# Patient Record
Sex: Female | Born: 1955
Health system: Southern US, Community
[De-identification: ages and names within clinical notes are randomized; demographics above are authoritative.]

## PROBLEM LIST (undated history)

## (undated) DIAGNOSIS — N183 Chronic kidney disease, stage 3 unspecified: Secondary | ICD-10-CM

## (undated) DIAGNOSIS — E1129 Type 2 diabetes mellitus with other diabetic kidney complication: Secondary | ICD-10-CM

## (undated) DIAGNOSIS — I10 Essential (primary) hypertension: Secondary | ICD-10-CM

## (undated) HISTORY — DX: Type 2 diabetes mellitus with other diabetic kidney complication: E11.29

## (undated) HISTORY — DX: Chronic kidney disease, stage 3 unspecified: N18.30

## (undated) HISTORY — PX: CHOLECYSTECTOMY: SHX55

---
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
## 2000-01-09 HISTORY — PX: ABDOMINAL HYSTERECTOMY: SHX81

## 2005-03-16 ENCOUNTER — Ambulatory Visit (HOSPITAL_COMMUNITY): Payer: Self-pay | Admitting: Psychiatry

## 2014-01-18 DIAGNOSIS — I10 Essential (primary) hypertension: Secondary | ICD-10-CM | POA: Diagnosis not present

## 2014-01-18 DIAGNOSIS — E1165 Type 2 diabetes mellitus with hyperglycemia: Secondary | ICD-10-CM | POA: Diagnosis not present

## 2014-01-18 DIAGNOSIS — M1000 Idiopathic gout, unspecified site: Secondary | ICD-10-CM | POA: Diagnosis not present

## 2014-01-18 DIAGNOSIS — F3131 Bipolar disorder, current episode depressed, mild: Secondary | ICD-10-CM | POA: Diagnosis not present

## 2014-01-18 DIAGNOSIS — Z1239 Encounter for other screening for malignant neoplasm of breast: Secondary | ICD-10-CM | POA: Diagnosis not present

## 2014-02-03 DIAGNOSIS — Z1239 Encounter for other screening for malignant neoplasm of breast: Secondary | ICD-10-CM | POA: Diagnosis not present

## 2014-02-03 DIAGNOSIS — Z1231 Encounter for screening mammogram for malignant neoplasm of breast: Secondary | ICD-10-CM | POA: Diagnosis not present

## 2014-05-06 DIAGNOSIS — R1084 Generalized abdominal pain: Secondary | ICD-10-CM | POA: Diagnosis not present

## 2014-05-19 DIAGNOSIS — I10 Essential (primary) hypertension: Secondary | ICD-10-CM | POA: Diagnosis not present

## 2014-05-19 DIAGNOSIS — E1165 Type 2 diabetes mellitus with hyperglycemia: Secondary | ICD-10-CM | POA: Diagnosis not present

## 2014-05-19 DIAGNOSIS — F3131 Bipolar disorder, current episode depressed, mild: Secondary | ICD-10-CM | POA: Diagnosis not present

## 2014-08-02 DIAGNOSIS — K120 Recurrent oral aphthae: Secondary | ICD-10-CM | POA: Diagnosis not present

## 2015-03-24 DIAGNOSIS — E119 Type 2 diabetes mellitus without complications: Secondary | ICD-10-CM | POA: Diagnosis not present

## 2015-07-06 DIAGNOSIS — E1165 Type 2 diabetes mellitus with hyperglycemia: Secondary | ICD-10-CM | POA: Diagnosis not present

## 2015-07-06 DIAGNOSIS — K589 Irritable bowel syndrome without diarrhea: Secondary | ICD-10-CM | POA: Diagnosis not present

## 2015-07-06 DIAGNOSIS — I10 Essential (primary) hypertension: Secondary | ICD-10-CM | POA: Diagnosis not present

## 2015-07-06 DIAGNOSIS — Z1239 Encounter for other screening for malignant neoplasm of breast: Secondary | ICD-10-CM | POA: Diagnosis not present

## 2015-07-06 DIAGNOSIS — Z23 Encounter for immunization: Secondary | ICD-10-CM | POA: Diagnosis not present

## 2015-08-22 DIAGNOSIS — K580 Irritable bowel syndrome with diarrhea: Secondary | ICD-10-CM | POA: Diagnosis not present

## 2015-08-25 DIAGNOSIS — M25561 Pain in right knee: Secondary | ICD-10-CM | POA: Diagnosis not present

## 2015-09-08 DIAGNOSIS — I10 Essential (primary) hypertension: Secondary | ICD-10-CM | POA: Diagnosis not present

## 2015-09-08 DIAGNOSIS — E1165 Type 2 diabetes mellitus with hyperglycemia: Secondary | ICD-10-CM | POA: Diagnosis not present

## 2015-09-08 DIAGNOSIS — Z1239 Encounter for other screening for malignant neoplasm of breast: Secondary | ICD-10-CM | POA: Diagnosis not present

## 2015-09-08 DIAGNOSIS — M1A9XX0 Chronic gout, unspecified, without tophus (tophi): Secondary | ICD-10-CM | POA: Diagnosis not present

## 2015-09-08 DIAGNOSIS — Z0000 Encounter for general adult medical examination without abnormal findings: Secondary | ICD-10-CM | POA: Diagnosis not present

## 2015-10-19 DIAGNOSIS — Z0000 Encounter for general adult medical examination without abnormal findings: Secondary | ICD-10-CM | POA: Diagnosis not present

## 2015-10-19 DIAGNOSIS — Z1239 Encounter for other screening for malignant neoplasm of breast: Secondary | ICD-10-CM | POA: Diagnosis not present

## 2015-10-19 DIAGNOSIS — E1165 Type 2 diabetes mellitus with hyperglycemia: Secondary | ICD-10-CM | POA: Diagnosis not present

## 2015-10-19 DIAGNOSIS — I10 Essential (primary) hypertension: Secondary | ICD-10-CM | POA: Diagnosis not present

## 2015-10-19 DIAGNOSIS — Z79899 Other long term (current) drug therapy: Secondary | ICD-10-CM | POA: Diagnosis not present

## 2015-10-19 DIAGNOSIS — M1A9XX0 Chronic gout, unspecified, without tophus (tophi): Secondary | ICD-10-CM | POA: Diagnosis not present

## 2015-10-23 DIAGNOSIS — R21 Rash and other nonspecific skin eruption: Secondary | ICD-10-CM | POA: Diagnosis not present

## 2015-11-16 DIAGNOSIS — K582 Mixed irritable bowel syndrome: Secondary | ICD-10-CM | POA: Diagnosis not present

## 2015-11-16 DIAGNOSIS — I10 Essential (primary) hypertension: Secondary | ICD-10-CM | POA: Diagnosis not present

## 2015-11-16 DIAGNOSIS — E1165 Type 2 diabetes mellitus with hyperglycemia: Secondary | ICD-10-CM | POA: Diagnosis not present

## 2015-11-16 DIAGNOSIS — Z23 Encounter for immunization: Secondary | ICD-10-CM | POA: Diagnosis not present

## 2016-03-15 DIAGNOSIS — Z79899 Other long term (current) drug therapy: Secondary | ICD-10-CM | POA: Diagnosis not present

## 2016-03-15 DIAGNOSIS — E1165 Type 2 diabetes mellitus with hyperglycemia: Secondary | ICD-10-CM | POA: Diagnosis not present

## 2016-03-15 DIAGNOSIS — I10 Essential (primary) hypertension: Secondary | ICD-10-CM | POA: Diagnosis not present

## 2016-05-16 DIAGNOSIS — M542 Cervicalgia: Secondary | ICD-10-CM | POA: Diagnosis not present

## 2016-05-16 DIAGNOSIS — M25512 Pain in left shoulder: Secondary | ICD-10-CM | POA: Diagnosis not present

## 2016-06-17 DIAGNOSIS — A932 Colorado tick fever: Secondary | ICD-10-CM | POA: Diagnosis not present

## 2016-07-18 DIAGNOSIS — M7580 Other shoulder lesions, unspecified shoulder: Secondary | ICD-10-CM | POA: Diagnosis not present

## 2016-07-18 DIAGNOSIS — M25461 Effusion, right knee: Secondary | ICD-10-CM | POA: Diagnosis not present

## 2016-09-19 DIAGNOSIS — L988 Other specified disorders of the skin and subcutaneous tissue: Secondary | ICD-10-CM | POA: Diagnosis not present

## 2016-10-02 DIAGNOSIS — K134 Granuloma and granuloma-like lesions of oral mucosa: Secondary | ICD-10-CM | POA: Diagnosis not present

## 2016-10-02 DIAGNOSIS — Q383 Other congenital malformations of tongue: Secondary | ICD-10-CM | POA: Diagnosis not present

## 2016-10-08 DIAGNOSIS — I10 Essential (primary) hypertension: Secondary | ICD-10-CM | POA: Diagnosis not present

## 2016-10-08 DIAGNOSIS — Z23 Encounter for immunization: Secondary | ICD-10-CM | POA: Diagnosis not present

## 2016-10-08 DIAGNOSIS — E1165 Type 2 diabetes mellitus with hyperglycemia: Secondary | ICD-10-CM | POA: Diagnosis not present

## 2016-10-08 DIAGNOSIS — Z79899 Other long term (current) drug therapy: Secondary | ICD-10-CM | POA: Diagnosis not present

## 2016-10-09 DIAGNOSIS — F319 Bipolar disorder, unspecified: Secondary | ICD-10-CM | POA: Diagnosis not present

## 2016-10-09 DIAGNOSIS — I10 Essential (primary) hypertension: Secondary | ICD-10-CM | POA: Diagnosis not present

## 2016-10-09 DIAGNOSIS — D3702 Neoplasm of uncertain behavior of tongue: Secondary | ICD-10-CM | POA: Diagnosis not present

## 2016-10-09 DIAGNOSIS — J342 Deviated nasal septum: Secondary | ICD-10-CM | POA: Diagnosis not present

## 2016-11-05 DIAGNOSIS — M25461 Effusion, right knee: Secondary | ICD-10-CM | POA: Diagnosis not present

## 2016-11-05 DIAGNOSIS — M25561 Pain in right knee: Secondary | ICD-10-CM | POA: Diagnosis not present

## 2016-11-07 DIAGNOSIS — M1990 Unspecified osteoarthritis, unspecified site: Secondary | ICD-10-CM | POA: Diagnosis not present

## 2016-11-07 DIAGNOSIS — I10 Essential (primary) hypertension: Secondary | ICD-10-CM | POA: Diagnosis not present

## 2016-11-07 DIAGNOSIS — E669 Obesity, unspecified: Secondary | ICD-10-CM | POA: Diagnosis not present

## 2016-11-07 DIAGNOSIS — K140 Glossitis: Secondary | ICD-10-CM | POA: Diagnosis not present

## 2016-11-07 DIAGNOSIS — Z79899 Other long term (current) drug therapy: Secondary | ICD-10-CM | POA: Diagnosis not present

## 2016-11-07 DIAGNOSIS — E119 Type 2 diabetes mellitus without complications: Secondary | ICD-10-CM | POA: Diagnosis not present

## 2016-11-07 DIAGNOSIS — D3702 Neoplasm of uncertain behavior of tongue: Secondary | ICD-10-CM | POA: Diagnosis not present

## 2016-11-07 DIAGNOSIS — F319 Bipolar disorder, unspecified: Secondary | ICD-10-CM | POA: Diagnosis not present

## 2016-11-07 DIAGNOSIS — Z6839 Body mass index (BMI) 39.0-39.9, adult: Secondary | ICD-10-CM | POA: Diagnosis not present

## 2016-11-07 HISTORY — PX: TONGUE SURGERY: SHX810

## 2016-11-21 DIAGNOSIS — M25561 Pain in right knee: Secondary | ICD-10-CM | POA: Diagnosis not present

## 2016-12-13 DIAGNOSIS — M25561 Pain in right knee: Secondary | ICD-10-CM | POA: Diagnosis not present

## 2016-12-13 DIAGNOSIS — I10 Essential (primary) hypertension: Secondary | ICD-10-CM | POA: Diagnosis not present

## 2016-12-13 DIAGNOSIS — Z01810 Encounter for preprocedural cardiovascular examination: Secondary | ICD-10-CM | POA: Diagnosis not present

## 2016-12-13 DIAGNOSIS — E1165 Type 2 diabetes mellitus with hyperglycemia: Secondary | ICD-10-CM | POA: Diagnosis not present

## 2016-12-19 DIAGNOSIS — F418 Other specified anxiety disorders: Secondary | ICD-10-CM | POA: Diagnosis not present

## 2016-12-24 DIAGNOSIS — M1A9XX0 Chronic gout, unspecified, without tophus (tophi): Secondary | ICD-10-CM | POA: Diagnosis not present

## 2016-12-24 DIAGNOSIS — Z6837 Body mass index (BMI) 37.0-37.9, adult: Secondary | ICD-10-CM | POA: Diagnosis not present

## 2016-12-24 DIAGNOSIS — J040 Acute laryngitis: Secondary | ICD-10-CM | POA: Diagnosis not present

## 2016-12-24 DIAGNOSIS — Z9181 History of falling: Secondary | ICD-10-CM | POA: Diagnosis not present

## 2016-12-24 DIAGNOSIS — E1165 Type 2 diabetes mellitus with hyperglycemia: Secondary | ICD-10-CM | POA: Diagnosis not present

## 2016-12-24 DIAGNOSIS — Z90710 Acquired absence of both cervix and uterus: Secondary | ICD-10-CM | POA: Diagnosis not present

## 2016-12-24 DIAGNOSIS — E669 Obesity, unspecified: Secondary | ICD-10-CM | POA: Diagnosis not present

## 2016-12-24 DIAGNOSIS — Z1211 Encounter for screening for malignant neoplasm of colon: Secondary | ICD-10-CM | POA: Diagnosis not present

## 2016-12-24 DIAGNOSIS — F3131 Bipolar disorder, current episode depressed, mild: Secondary | ICD-10-CM | POA: Diagnosis not present

## 2016-12-24 DIAGNOSIS — Z0000 Encounter for general adult medical examination without abnormal findings: Secondary | ICD-10-CM | POA: Diagnosis not present

## 2016-12-24 DIAGNOSIS — K580 Irritable bowel syndrome with diarrhea: Secondary | ICD-10-CM | POA: Diagnosis not present

## 2017-01-16 DIAGNOSIS — S83231A Complex tear of medial meniscus, current injury, right knee, initial encounter: Secondary | ICD-10-CM | POA: Diagnosis not present

## 2017-01-16 DIAGNOSIS — M1711 Unilateral primary osteoarthritis, right knee: Secondary | ICD-10-CM | POA: Diagnosis not present

## 2017-01-17 DIAGNOSIS — Z01810 Encounter for preprocedural cardiovascular examination: Secondary | ICD-10-CM | POA: Diagnosis not present

## 2017-01-17 DIAGNOSIS — I10 Essential (primary) hypertension: Secondary | ICD-10-CM | POA: Diagnosis not present

## 2017-01-17 DIAGNOSIS — Z01818 Encounter for other preprocedural examination: Secondary | ICD-10-CM | POA: Diagnosis not present

## 2017-01-17 DIAGNOSIS — M1990 Unspecified osteoarthritis, unspecified site: Secondary | ICD-10-CM | POA: Diagnosis not present

## 2017-01-17 DIAGNOSIS — M109 Gout, unspecified: Secondary | ICD-10-CM | POA: Diagnosis not present

## 2017-01-17 DIAGNOSIS — F319 Bipolar disorder, unspecified: Secondary | ICD-10-CM | POA: Diagnosis not present

## 2017-01-17 DIAGNOSIS — F418 Other specified anxiety disorders: Secondary | ICD-10-CM | POA: Diagnosis not present

## 2017-01-17 DIAGNOSIS — M23321 Other meniscus derangements, posterior horn of medial meniscus, right knee: Secondary | ICD-10-CM | POA: Diagnosis not present

## 2017-01-17 DIAGNOSIS — M9429 Chondromalacia, multiple sites: Secondary | ICD-10-CM | POA: Diagnosis not present

## 2017-01-17 DIAGNOSIS — M2241 Chondromalacia patellae, right knee: Secondary | ICD-10-CM | POA: Diagnosis not present

## 2017-01-17 DIAGNOSIS — Z79899 Other long term (current) drug therapy: Secondary | ICD-10-CM | POA: Diagnosis not present

## 2017-01-17 DIAGNOSIS — E7800 Pure hypercholesterolemia, unspecified: Secondary | ICD-10-CM | POA: Diagnosis not present

## 2017-01-17 DIAGNOSIS — S83231A Complex tear of medial meniscus, current injury, right knee, initial encounter: Secondary | ICD-10-CM | POA: Diagnosis not present

## 2017-01-17 DIAGNOSIS — M6588 Other synovitis and tenosynovitis, other site: Secondary | ICD-10-CM | POA: Diagnosis not present

## 2017-01-17 DIAGNOSIS — Z7984 Long term (current) use of oral hypoglycemic drugs: Secondary | ICD-10-CM | POA: Diagnosis not present

## 2017-01-17 DIAGNOSIS — Z6839 Body mass index (BMI) 39.0-39.9, adult: Secondary | ICD-10-CM | POA: Diagnosis not present

## 2017-01-17 DIAGNOSIS — G4700 Insomnia, unspecified: Secondary | ICD-10-CM | POA: Diagnosis not present

## 2017-01-17 DIAGNOSIS — E119 Type 2 diabetes mellitus without complications: Secondary | ICD-10-CM | POA: Diagnosis not present

## 2017-01-21 DIAGNOSIS — M6281 Muscle weakness (generalized): Secondary | ICD-10-CM | POA: Diagnosis not present

## 2017-01-21 DIAGNOSIS — M1711 Unilateral primary osteoarthritis, right knee: Secondary | ICD-10-CM | POA: Diagnosis not present

## 2017-01-21 DIAGNOSIS — M25561 Pain in right knee: Secondary | ICD-10-CM | POA: Diagnosis not present

## 2017-01-21 DIAGNOSIS — S83231D Complex tear of medial meniscus, current injury, right knee, subsequent encounter: Secondary | ICD-10-CM | POA: Diagnosis not present

## 2017-02-04 DIAGNOSIS — L501 Idiopathic urticaria: Secondary | ICD-10-CM | POA: Diagnosis not present

## 2017-02-21 DIAGNOSIS — E119 Type 2 diabetes mellitus without complications: Secondary | ICD-10-CM | POA: Diagnosis not present

## 2017-03-06 DIAGNOSIS — S83411A Sprain of medial collateral ligament of right knee, initial encounter: Secondary | ICD-10-CM | POA: Diagnosis not present

## 2017-03-06 DIAGNOSIS — M76891 Other specified enthesopathies of right lower limb, excluding foot: Secondary | ICD-10-CM | POA: Diagnosis not present

## 2017-03-06 DIAGNOSIS — M1711 Unilateral primary osteoarthritis, right knee: Secondary | ICD-10-CM | POA: Diagnosis not present

## 2017-03-06 DIAGNOSIS — G8918 Other acute postprocedural pain: Secondary | ICD-10-CM | POA: Diagnosis not present

## 2017-03-07 DIAGNOSIS — S83411A Sprain of medial collateral ligament of right knee, initial encounter: Secondary | ICD-10-CM | POA: Diagnosis not present

## 2017-06-26 DIAGNOSIS — I10 Essential (primary) hypertension: Secondary | ICD-10-CM | POA: Diagnosis not present

## 2017-07-01 DIAGNOSIS — I10 Essential (primary) hypertension: Secondary | ICD-10-CM | POA: Diagnosis not present

## 2017-07-09 DIAGNOSIS — I10 Essential (primary) hypertension: Secondary | ICD-10-CM | POA: Diagnosis not present

## 2017-08-08 DIAGNOSIS — I10 Essential (primary) hypertension: Secondary | ICD-10-CM | POA: Diagnosis not present

## 2017-08-08 DIAGNOSIS — Z6839 Body mass index (BMI) 39.0-39.9, adult: Secondary | ICD-10-CM | POA: Diagnosis not present

## 2017-08-08 DIAGNOSIS — E669 Obesity, unspecified: Secondary | ICD-10-CM | POA: Diagnosis not present

## 2017-08-19 DIAGNOSIS — S63602A Unspecified sprain of left thumb, initial encounter: Secondary | ICD-10-CM | POA: Diagnosis not present

## 2017-09-10 DIAGNOSIS — Z6839 Body mass index (BMI) 39.0-39.9, adult: Secondary | ICD-10-CM | POA: Diagnosis not present

## 2017-09-10 DIAGNOSIS — I10 Essential (primary) hypertension: Secondary | ICD-10-CM | POA: Diagnosis not present

## 2017-09-10 DIAGNOSIS — E669 Obesity, unspecified: Secondary | ICD-10-CM | POA: Diagnosis not present

## 2017-10-24 DIAGNOSIS — M1A9XX0 Chronic gout, unspecified, without tophus (tophi): Secondary | ICD-10-CM | POA: Diagnosis not present

## 2017-10-24 DIAGNOSIS — Z6841 Body Mass Index (BMI) 40.0 and over, adult: Secondary | ICD-10-CM | POA: Diagnosis not present

## 2017-10-24 DIAGNOSIS — I10 Essential (primary) hypertension: Secondary | ICD-10-CM | POA: Diagnosis not present

## 2017-10-24 DIAGNOSIS — Z23 Encounter for immunization: Secondary | ICD-10-CM | POA: Diagnosis not present

## 2017-10-24 DIAGNOSIS — E1169 Type 2 diabetes mellitus with other specified complication: Secondary | ICD-10-CM | POA: Diagnosis not present

## 2017-10-24 DIAGNOSIS — F3131 Bipolar disorder, current episode depressed, mild: Secondary | ICD-10-CM | POA: Diagnosis not present

## 2017-10-24 DIAGNOSIS — Z79899 Other long term (current) drug therapy: Secondary | ICD-10-CM | POA: Diagnosis not present

## 2017-11-02 ENCOUNTER — Observation Stay (HOSPITAL_COMMUNITY)
Admission: EM | Admit: 2017-11-02 | Discharge: 2017-11-03 | Disposition: A | Discharge status: Home / Self Care | Payer: PPO | Attending: Internal Medicine | Admitting: Internal Medicine

## 2017-11-02 ENCOUNTER — Emergency Department (HOSPITAL_COMMUNITY): Payer: PPO

## 2017-11-02 ENCOUNTER — Other Ambulatory Visit: Payer: Self-pay

## 2017-11-02 ENCOUNTER — Encounter (HOSPITAL_COMMUNITY): Payer: Self-pay | Admitting: Emergency Medicine

## 2017-11-02 DIAGNOSIS — R7989 Other specified abnormal findings of blood chemistry: Secondary | ICD-10-CM

## 2017-11-02 DIAGNOSIS — I10 Essential (primary) hypertension: Secondary | ICD-10-CM | POA: Diagnosis not present

## 2017-11-02 DIAGNOSIS — F319 Bipolar disorder, unspecified: Secondary | ICD-10-CM | POA: Diagnosis not present

## 2017-11-02 DIAGNOSIS — T461X5A Adverse effect of calcium-channel blockers, initial encounter: Secondary | ICD-10-CM | POA: Insufficient documentation

## 2017-11-02 DIAGNOSIS — E669 Obesity, unspecified: Secondary | ICD-10-CM | POA: Diagnosis not present

## 2017-11-02 DIAGNOSIS — R7303 Prediabetes: Secondary | ICD-10-CM | POA: Diagnosis not present

## 2017-11-02 DIAGNOSIS — R55 Syncope and collapse: Principal | ICD-10-CM | POA: Insufficient documentation

## 2017-11-02 DIAGNOSIS — R001 Bradycardia, unspecified: Secondary | ICD-10-CM | POA: Insufficient documentation

## 2017-11-02 DIAGNOSIS — R42 Dizziness and giddiness: Secondary | ICD-10-CM | POA: Diagnosis not present

## 2017-11-02 DIAGNOSIS — N179 Acute kidney failure, unspecified: Secondary | ICD-10-CM | POA: Insufficient documentation

## 2017-11-02 DIAGNOSIS — R51 Headache: Secondary | ICD-10-CM | POA: Diagnosis not present

## 2017-11-02 DIAGNOSIS — Z79899 Other long term (current) drug therapy: Secondary | ICD-10-CM | POA: Diagnosis not present

## 2017-11-02 DIAGNOSIS — E876 Hypokalemia: Secondary | ICD-10-CM | POA: Insufficient documentation

## 2017-11-02 DIAGNOSIS — I674 Hypertensive encephalopathy: Secondary | ICD-10-CM | POA: Diagnosis not present

## 2017-11-02 DIAGNOSIS — R748 Abnormal levels of other serum enzymes: Secondary | ICD-10-CM | POA: Diagnosis not present

## 2017-11-02 HISTORY — DX: Essential (primary) hypertension: I10

## 2017-11-02 HISTORY — DX: Syncope and collapse: R55

## 2017-11-02 LAB — COMPREHENSIVE METABOLIC PANEL
ALK PHOS: 100 U/L (ref 38–126)
ALT: 25 U/L (ref 0–44)
AST: 22 U/L (ref 15–41)
Albumin: 4.2 g/dL (ref 3.5–5.0)
Anion gap: 10 (ref 5–15)
BILIRUBIN TOTAL: 0.7 mg/dL (ref 0.3–1.2)
BUN: 28 mg/dL — ABNORMAL HIGH (ref 8–23)
CALCIUM: 9.8 mg/dL (ref 8.9–10.3)
CO2: 26 mmol/L (ref 22–32)
CREATININE: 1.52 mg/dL — AB (ref 0.44–1.00)
Chloride: 103 mmol/L (ref 98–111)
GFR calc Af Amer: 41 mL/min — ABNORMAL LOW (ref >60)
GFR calc non Af Amer: 36 mL/min — ABNORMAL LOW (ref >60)
Glucose, Bld: 119 mg/dL — ABNORMAL HIGH (ref 70–99)
Potassium: 3.5 mmol/L (ref 3.5–5.1)
SODIUM: 139 mmol/L (ref 135–145)
TOTAL PROTEIN: 7.1 g/dL (ref 6.5–8.1)

## 2017-11-02 LAB — I-STAT CHEM 8, ED
BUN: 31 mg/dL — AB (ref 8–23)
CHLORIDE: 102 mmol/L (ref 98–111)
Calcium, Ion: 1.17 mmol/L (ref 1.15–1.40)
Creatinine, Ser: 1.6 mg/dL — ABNORMAL HIGH (ref 0.44–1.00)
Glucose, Bld: 110 mg/dL — ABNORMAL HIGH (ref 70–99)
HCT: 46 % (ref 36.0–46.0)
Hemoglobin: 15.6 g/dL — ABNORMAL HIGH (ref 12.0–15.0)
Potassium: 3.4 mmol/L — ABNORMAL LOW (ref 3.5–5.1)
Sodium: 140 mmol/L (ref 135–145)
TCO2: 29 mmol/L (ref 22–32)

## 2017-11-02 LAB — DIFFERENTIAL
Abs Immature Granulocytes: 0.06 10*3/uL (ref 0.00–0.07)
BASOS ABS: 0.1 10*3/uL (ref 0.0–0.1)
Basophils Relative: 1 %
Eosinophils Absolute: 0.2 10*3/uL (ref 0.0–0.5)
Eosinophils Relative: 3 %
Immature Granulocytes: 1 %
LYMPHS ABS: 1.9 10*3/uL (ref 0.7–4.0)
LYMPHS PCT: 27 %
MONOS PCT: 12 %
Monocytes Absolute: 0.8 10*3/uL (ref 0.1–1.0)
NEUTROS ABS: 3.8 10*3/uL (ref 1.7–7.7)
Neutrophils Relative %: 56 %

## 2017-11-02 LAB — APTT: aPTT: 28 seconds (ref 24–36)

## 2017-11-02 LAB — CBC
HEMATOCRIT: 46.2 % — AB (ref 36.0–46.0)
HEMOGLOBIN: 14.4 g/dL (ref 12.0–15.0)
MCH: 28.9 pg (ref 26.0–34.0)
MCHC: 31.2 g/dL (ref 30.0–36.0)
MCV: 92.6 fL (ref 80.0–100.0)
Platelets: 280 10*3/uL (ref 150–400)
RBC: 4.99 MIL/uL (ref 3.87–5.11)
RDW: 14 % (ref 11.5–15.5)
WBC: 6.8 10*3/uL (ref 4.0–10.5)
nRBC: 0 % (ref 0.0–0.2)

## 2017-11-02 LAB — PROTIME-INR
INR: 1
PROTHROMBIN TIME: 13.1 s (ref 11.4–15.2)

## 2017-11-02 LAB — I-STAT TROPONIN, ED: Troponin i, poc: 0 ng/mL (ref 0.00–0.08)

## 2017-11-02 MED ORDER — ACETAMINOPHEN 650 MG RE SUPP: 650.0000 mg | Freq: Four times a day (QID) | RECTAL | Status: DC | PRN

## 2017-11-02 MED ORDER — LOSARTAN POTASSIUM 50 MG PO TABS
100.0000 mg | ORAL_TABLET | Freq: Every day | ORAL | Status: DC
  Administered 2017-11-03: 100 mg via ORAL
  Filled 2017-11-02: qty 2

## 2017-11-02 MED ORDER — ENOXAPARIN SODIUM 40 MG/0.4ML ~~LOC~~ SOLN
40.0000 mg | Freq: Every day | SUBCUTANEOUS | Status: DC
  Filled 2017-11-02: qty 0.4

## 2017-11-02 MED ORDER — POTASSIUM CHLORIDE CRYS ER 20 MEQ PO TBCR
40.0000 meq | EXTENDED_RELEASE_TABLET | Freq: Once | ORAL | Status: AC
  Administered 2017-11-03: 40 meq via ORAL
  Filled 2017-11-02: qty 2

## 2017-11-02 MED ORDER — ACETAMINOPHEN 325 MG PO TABS: 650.0000 mg | ORAL_TABLET | Freq: Four times a day (QID) | ORAL | Status: DC | PRN

## 2017-11-02 MED ORDER — BUPROPION HCL ER (XL) 150 MG PO TB24
300.0000 mg | ORAL_TABLET | Freq: Every day | ORAL | Status: DC
  Administered 2017-11-03: 300 mg via ORAL
  Filled 2017-11-02: qty 2

## 2017-11-02 MED ORDER — CLONIDINE HCL 0.1 MG PO TABS
0.1000 mg | ORAL_TABLET | Freq: Two times a day (BID) | ORAL | Status: DC
  Administered 2017-11-03: 0.1 mg via ORAL
  Filled 2017-11-02: qty 1

## 2017-11-02 MED ORDER — ATENOLOL 25 MG PO TABS: 25.0000 mg | ORAL_TABLET | Freq: Two times a day (BID) | ORAL | Status: DC

## 2017-11-02 MED ORDER — HYDROCHLOROTHIAZIDE 25 MG PO TABS
25.0000 mg | ORAL_TABLET | Freq: Every day | ORAL | Status: DC
  Filled 2017-11-02: qty 1

## 2017-11-02 MED ORDER — LOSARTAN POTASSIUM 50 MG PO TABS: 100.0000 mg | ORAL_TABLET | Freq: Every day | ORAL | Status: DC

## 2017-11-02 MED ORDER — QUETIAPINE FUMARATE 100 MG PO TABS
100.0000 mg | ORAL_TABLET | Freq: Every day | ORAL | Status: DC
  Administered 2017-11-03: 100 mg via ORAL
  Filled 2017-11-02 (×2): qty 1

## 2017-11-02 NOTE — ED Provider Notes (Signed)
[place] EMERGENCY DEPARTMENT Provider Note   CSN: [id number] Arrival date & time: 11/02/17  1539     History   Chief Complaint Chief Complaint  Patient presents with  . Dizziness    HPI Donna Castaneda is a 62 y.o. female who presents with lightheadedness and syncope. PMH significant for HTN, bipolar d/o. She states that since June she has not been able to control her blood pressure. She was on Losartan/HCTZ and Atenolol only. She went to her doctor and her BP was in the 190s therefore Clonidine was added. She went for another check up and the dose was doubled. It still wasn't controlled and Losartan was changed to Olmesartan but this made her feel bad and have IBS symptoms so it was changed back to Losartan and she was put on Amlodipine. She has been taking this for about a week. She gets lightheaded with getting up and states that when she is in a car and going around a curve it feels like she's "on a rollercoaster" and like she was "tired". She went to UC today and they told her to come to the ED. She doesn't like [facility] so she came here. She denies current headache, chest pain, SOB, abdominal pain, leg swelling. She hasn't taken any BP meds today. She did have a syncopal episode on Tuesday. She was walking to the kitchen and had sudden LOC without prodromal symptoms.  HPI  Past Medical History:  Diagnosis Date  . Hypertension     There are no active problems to display for this patient.   History reviewed. No pertinent surgical history.   OB History   None      Home Medications    Prior to Admission medications   Not on File    Family History No family history on file.  Social History Social History   Tobacco Use  . Smoking status: Never Smoker  . Smokeless tobacco: Never Used  Substance Use Topics  . Alcohol use: Not Currently  . Drug use: Never     Allergies   Patient has no known allergies.   Review of  Systems Review of Systems  Constitutional: Negative for fever.  Respiratory: Negative for shortness of breath.   Cardiovascular: Negative for chest pain.  Gastrointestinal: Negative for abdominal pain.  Neurological: Positive for syncope and light-headedness. Negative for headaches.  All other systems reviewed and are negative.    Physical Exam Updated Vital Signs BP (!) 146/99   Pulse (!) 57   Temp 97.9 F (36.6 C) (Oral)   Resp (!) 21   SpO2 100%   Physical Exam  Constitutional: She is oriented to person, place, and time. She appears well-developed and well-nourished. No distress.  Calm, cooperative, pleasant  HENT:  Head: Normocephalic and atraumatic.  Eyes: Pupils are equal, round, and reactive to light. Conjunctivae are normal. Right eye exhibits no discharge. Left eye exhibits no discharge. No scleral icterus.  Neck: Normal range of motion.  Cardiovascular: Normal rate and regular rhythm.  Pulmonary/Chest: Effort normal and breath sounds normal. No respiratory distress.  Abdominal: Soft. Bowel sounds are normal. She exhibits no distension. There is no tenderness.  Musculoskeletal:  No peripheral edema  Neurological: She is alert and oriented to person, place, and time.  Skin: Skin is warm and dry.  Psychiatric: She has a normal mood and affect. Her behavior is normal.  Nursing note and vitals reviewed.    ED Treatments / Results  Labs (all  labs ordered are listed, but only abnormal results are displayed) Labs Reviewed  CBC - Abnormal; Notable for the following components:      Result Value   HCT 46.2 (*)    All other components within normal limits  COMPREHENSIVE METABOLIC PANEL - Abnormal; Notable for the following components:   Glucose, Bld 119 (*)    BUN 28 (*)    Creatinine, Ser 1.52 (*)    GFR calc non Af Amer 36 (*)    GFR calc Af Amer 41 (*)    All other components within normal limits  I-STAT CHEM 8, ED - Abnormal; Notable for the following  components:   Potassium 3.4 (*)    BUN 31 (*)    Creatinine, Ser 1.60 (*)    Glucose, Bld 110 (*)    Hemoglobin 15.6 (*)    All other components within normal limits  PROTIME-INR  APTT  DIFFERENTIAL  I-STAT TROPONIN, ED    EKG EKG Interpretation  Date/Time:  Saturday November 02 2017 16:17:38 EDT Ventricular Rate:  56 PR Interval:  174 QRS Duration: 82 QT Interval:  436 QTC Calculation: 420 R Axis:   -9 Text Interpretation:  Sinus bradycardia Cannot rule out Anterior infarct , age undetermined Abnormal ECG no acute ischemic change. no old comparison Confirmed by Charlesetta Shanks [phone]) on 11/02/2017 10:08:08 PM   Radiology Ct Head Wo Contrast  Result Date: 11/02/2017 CLINICAL DATA:  Hypertension, headache and dizziness over the last several weeks. EXAM: CT HEAD WITHOUT CONTRAST TECHNIQUE: Contiguous axial images were obtained from the base of the skull through the vertex without intravenous contrast. COMPARISON:  Brain MRI, 04/11/2010 FINDINGS: Brain: No evidence of acute infarction, hemorrhage, hydrocephalus, extra-axial collection or mass lesion/mass effect. Vascular: No hyperdense vessel or unexpected calcification. Skull: Normal. Negative for fracture or focal lesion. Sinuses/Orbits: Visualized globes and orbits are within normal limits. Visualized sinuses and mastoid air cells are clear. Other: None. IMPRESSION: Normal unenhanced CT scan of the brain. Electronically Signed   By: Lajean Manes M.D.   On: 11/02/2017 18:26    Procedures Procedures (including critical care time)  Medications Ordered in ED Medications - No data to display   Initial Impression / Assessment and Plan / ED Course  I have reviewed the triage vital signs and the nursing notes.  Pertinent labs & imaging results that were available during my care of the patient were reviewed by me and considered in my medical decision making (see chart for details).  62 year old female presents with  lightheadedness and sudden onset of syncope. She is bradycardic. Blood pressures here have been ranging between 277A-128N systolic. Orthostatics are negative. EKG is sinus bradycardia. CBC is normal. CMP is remarkable for elevated SCr. She denies any kidney issues that she knows of. Trop is 0. In triage, a stroke order set was utilized. I do not feel that symptoms are consistent with stroke and sounds more related to her blood pressure vs cardiac etiology. Some concern for secondary hypertension since she is on 5 BP meds and still has significantly elevated BP. Due to her sudden syncope, will bring in for obs. Discussed with hospitalist for admission.  Final Clinical Impressions(s) / ED Diagnoses   Final diagnoses:  Syncope, unspecified syncope type  Elevated serum creatinine    ED Discharge Orders    None       Recardo Evangelist, PA-C 11/02/17 2349    Charlesetta Shanks, MD 11/03/17 [phone]

## 2017-11-02 NOTE — ED Triage Notes (Addendum)
Pt c/o constant dizziness x 1 month. Reports she has been having problems managing her BP, recently started on amlodipine. A&O x 4, ambulatory without difficulty.

## 2017-11-02 NOTE — H&P (Signed)
History and Physical  Donna Castaneda WCH:[id number] DOB: 1955-02-19 DOA: 11/02/2017 1612  Referring physician: Raquel James (ED) PCP: Greig Right, MD   HISTORY   Chief Complaint: syncope  HPI: Donna Castaneda is a 62 y.o. female with HTN, obesity, bipolar disorder, and pre-diabetes who presents with syncopal episode. Since around June, she has been diagnosed with severe HTN (prior baseline BP were in 353I systolics but jumped to 144-315Q at outside clinic). Since then, her PCP has been adding anti-HTN agents (taking total 5 agents) including clonidine and amlodipine in addition to her previous regimen. Her prior BP regimen includes losartan, atenolol, and HCTZ. Ever since being prescribed amlodipine last week, patient has noted dizzy spells. Patient uses a vitals tracker on her iPad which has documented BP as low as 110/80 with associated severe dizzy spell that occurred after taking the amlodipine while she was a passenger in the car. She does not recall clear orthostasis sx or dizziness prior to the syncopal event on day of admission -- she had been ambulating from the kitchen to the living room; but she does note having lightheadedness pretty much every day since starting amlodipine. Denies prior cardiac history or renal dysfunction.  Note: patient resides in [place] and sees PCP there (Dr. Burnett Sheng). No prior secondary HTN workup .    Review of Systems:  + syncope today and lightheadedness x 1 week - no fevers/chills - no cough - no chest pain, dyspnea on exertion - no edema, PND, orthopnea - no nausea/vomiting; no tarry, melanotic or bloody stools - no dysuria, increased urinary frequency - no weight changes  Rest of systems reviewed are negative, except as per above history.   ED course:  Vitals Blood pressure 139/66, pulse (!) 55, temperature 97.9 F (36.6 C), temperature source Oral, resp. rate 19, SpO2 100 %. CT head negative.   Past Medical History:  Diagnosis Date  .  Hypertension    History reviewed. No pertinent surgical history.  Social History:  reports that she has never smoked. She has never used smokeless tobacco. She reports that she drank alcohol. She reports that she does not use drugs.  No Known Allergies  No family history on file.    Prior to Admission medications   Medication Sig Start Date End Date Taking? Authorizing Provider  amLODipine (NORVASC) 5 MG tablet Take 5 mg by mouth daily.   Yes [provider]  atenolol (TENORMIN) 25 MG tablet Take 50 mg by mouth 2 (two) times daily.   Yes [provider]  buPROPion (WELLBUTRIN XL) 300 MG 24 hr tablet Take 300 mg by mouth daily.   Yes [provider]  cloNIDine (CATAPRES) 0.1 MG tablet Take 0.1 mg by mouth 2 (two) times daily.   Yes [provider]  hydrochlorothiazide (HYDRODIURIL) 25 MG tablet Take 25 mg by mouth daily.   Yes [provider]  losartan (COZAAR) 100 MG tablet Take 100 mg by mouth daily.   Yes [provider]  metFORMIN (GLUCOPHAGE) 500 MG tablet Take 500 mg by mouth 2 (two) times daily with a meal.   Yes [provider]  QUEtiapine (SEROQUEL) 100 MG tablet Take 100 mg by mouth at bedtime.   Yes [provider]    PHYSICAL EXAM   Temp:  [97.9 F (36.6 C)] 97.9 F (36.6 C) (10/26 1615) Pulse Rate:  [51-57] 55 (10/26 2145) Cardiac Rhythm: Normal sinus rhythm (10/26 1852) Resp:  [12-21] 19 (10/26 2145) BP: (138-160)/(66-109) 139/66 (  10/26 2145) SpO2:  [98 %-100 %] 100 % (10/26 2145)  BP 139/66   Pulse (!) 55   Temp 97.9 F (36.6 C) (Oral)   Resp 19   SpO2 100%    GEN morbidly obese middle-aged caucasian female; resting comfortably in bed, using her iPad  HEENT NCAT EOM intact PERRL; clear oropharynx, no cervical LAD; moist mucus membranes  JVP estimated 5-6 cm H2O above RA; no HJR ; no carotid bruits b/l ;  CV regular normal rate; quiet S1 and S2; no m/r/g or S3/S4; PMI non displaced; no  parasternal heave  RESP CTA b/l; breathing unlabored and symmetric  ABD soft NT ND +normoactive BS  EXT warm throughout b/l; no peripheral edema b/l  PULSES  DP and radials 2+ intact b/l  SKIN/MSK no rashes or lesions  NEURO/PSYCH AAOx4; no focal deficits   DATA   LABS ON ADMISSION:  Basic Metabolic Panel: Recent Labs  Lab 11/02/17 1633 11/02/17 1719  NA 139 140  K 3.5 3.4*  CL 103 102  CO2 26  --   GLUCOSE 119* 110*  BUN 28* 31*  CREATININE 1.52* 1.60*  CALCIUM 9.8  --    CBC: Recent Labs  Lab 11/02/17 1633 11/02/17 1719  WBC 6.8  --   NEUTROABS 3.8  --   HGB 14.4 15.6*  HCT 46.2* 46.0  MCV 92.6  --   PLT 280  --    Liver Function Tests: Recent Labs  Lab 11/02/17 1633  AST 22  ALT 25  ALKPHOS 100  BILITOT 0.7  PROT 7.1  ALBUMIN 4.2   No results for input(s): LIPASE, AMYLASE in the last 168 hours. No results for input(s): AMMONIA in the last 168 hours. Coagulation:  Lab Results  Component Value Date   INR 1.00 11/02/2017   No results found for: PTT Lactic Acid, Venous:  No results found for: LATICACIDVEN Cardiac Enzymes: No results for input(s): CKTOTAL, CKMB, CKMBINDEX, TROPONINI in the last 168 hours. Urinalysis: No results found for: COLORURINE, APPEARANCEUR, LABSPEC, PHURINE, GLUCOSEU, HGBUR, BILIRUBINUR, KETONESUR, PROTEINUR, UROBILINOGEN, NITRITE, LEUKOCYTESUR  BNP (last 3 results) No results for input(s): PROBNP in the last 8760 hours. CBG: No results for input(s): GLUCAP in the last 168 hours.  Radiological Exams on Admission: Ct Head Wo Contrast  Result Date: 11/02/2017 CLINICAL DATA:  Hypertension, headache and dizziness over the last several weeks. EXAM: CT HEAD WITHOUT CONTRAST TECHNIQUE: Contiguous axial images were obtained from the base of the skull through the vertex without intravenous contrast. COMPARISON:  Brain MRI, 04/11/2010 FINDINGS: Brain: No evidence of acute infarction, hemorrhage, hydrocephalus, extra-axial collection  or mass lesion/mass effect. Vascular: No hyperdense vessel or unexpected calcification. Skull: Normal. Negative for fracture or focal lesion. Sinuses/Orbits: Visualized globes and orbits are within normal limits. Visualized sinuses and mastoid air cells are clear. Other: None. IMPRESSION: Normal unenhanced CT scan of the brain. Electronically Signed   By: Lajean Manes M.D.   On: 11/02/2017 18:26    EKG: Independently reviewed. Sinus bradycardia at 56 bpm with no ST changes   ASSESSMENT AND PLAN   Assessment: Donna Castaneda is a 62 y.o. female with HTN, pre-diabetes, bipolar disorder, obesity, who presents with syncope. She has been having orthostatic sx for the past week leading up to the syncopal event, although no clear prodrome just prior to losing consciousness. Of note, her BP has become severely elevated over the past few months -- and her PCP has been adding anti-HTN agents to try to  control her BP. Her recent dizzy spells this past week that were documented on her iPad vitals tracker showed relative hypotension with BP 110/80s and HR in mid 50s shortly after taking her amlodipine. Thus her syncope most likely is secondary to her current anti-HTN regimen namely with the addition of amlodipine. However, given her stable yet chronic bradycardia 50s in setting of beta-blocker and clonidine and CCB (most likely explanation is iatrogenic blunting of chronotropic response) as well as unexplained elevation in BP warrants further monitoring for any arrhythmia or worsening bradycardia and at least start workup for secondary HTN, i.e. renovascular HTN. Reassuringly her recorded heart rates during previous orthostatic spells were stable in mid 50s. CT head was negative; negative troponin; EKG shows sinus brady 56 bpm. If no renal artery stenosis detected, she may also have underlying sleep apnea associated HTN.   Active Problems:   Syncope   Plan:   # Syncope -- unclear etiology, suspect due to  multiple anti-HTN agents (newest agents are amlodipine and clonidine).   > negative initial orthostats while in ED; BP range in 130-160s/80-90s; HR stable in 50s (unchanged)  - cautiously add back home anti-HTN agents as below, with the exception of amlodipine - resume losartan 100mg  qd to start tomorrow - resume HCTZ at lower dose 25mg  qd to start tomorrow (hold if hypokalemia or worsening renal dysfunction) - stop amlodipine - resume clonidine 0.1mg  BID tomorrow AM to avoid rebound - if HR remain stable in 50s, would opt to resume atenolol at a lower dose such as 25mg  qd (at home on 50mg  BID) since this is a chronic medication - telemetry - cardiac TTE ordered   # Severe HTN diagnosed ~ 1-2 months ago. On multiple anti-HTN agents as above  - renal doppler ordered  - echo ordered as above - check UA; TSH; renin-aldos; plasma metanephrines pending - adding back home BP meds as above - recommend outpatient sleep study   # AKI Cr 1.6 with unknown baseline. No prior knowledge of kidney issues per patient.  > cannot rule out pre-renal vs intrinsic - UA ordered - hold losartan if Cr worsens - instructed patient to avoid mobic (takes prn for shoulder/arm pain) at home - renal dopplers   # Hypokalemia K 3.4 > unknown baseline. Pt is on HCTZ at home - ordered PO K repletion - reordered HCTZ as above but hold if worsened hypokalemia  # Hx of chronic bipolar do - resume home seroquel 100mg  nightly - resume home bupropion XL 300mg  nightly  DVT Prophylaxis: lovenox Code Status:  Full Code Family Communication: patient Disposition Plan: admit to obs (telemetry) and dc pending echo and renal ultrasound  Patient contact: Extended Emergency Contact Information Primary Emergency Contact: Renegar,Robert Address: [address]          [place], [place] Phone: [phone] Relation: Other  Time spent: > 35 minutes  Colbert Ewing, MD [facility] Pager [phone]  If  7PM-7AM, please contact night-coverage [email or web address] Password [facility] 11/02/2017, 10:53 PM

## 2017-11-03 ENCOUNTER — Observation Stay (HOSPITAL_BASED_OUTPATIENT_CLINIC_OR_DEPARTMENT_OTHER): Payer: PPO

## 2017-11-03 DIAGNOSIS — E669 Obesity, unspecified: Secondary | ICD-10-CM

## 2017-11-03 DIAGNOSIS — I10 Essential (primary) hypertension: Secondary | ICD-10-CM

## 2017-11-03 DIAGNOSIS — I5030 Unspecified diastolic (congestive) heart failure: Secondary | ICD-10-CM | POA: Diagnosis not present

## 2017-11-03 HISTORY — DX: Essential (primary) hypertension: I10

## 2017-11-03 HISTORY — DX: Obesity, unspecified: E66.9

## 2017-11-03 LAB — CBC
HCT: 43.9 % (ref 36.0–46.0)
HCT: 45.4 % (ref 36.0–46.0)
Hemoglobin: 14 g/dL (ref 12.0–15.0)
Hemoglobin: 14.2 g/dL (ref 12.0–15.0)
MCH: 28.2 pg (ref 26.0–34.0)
MCH: 29.4 pg (ref 26.0–34.0)
MCHC: 30.8 g/dL (ref 30.0–36.0)
MCHC: 32.3 g/dL (ref 30.0–36.0)
MCV: 90.9 fL (ref 80.0–100.0)
MCV: 91.3 fL (ref 80.0–100.0)
NRBC: 0 % (ref 0.0–0.2)
PLATELETS: 265 10*3/uL (ref 150–400)
PLATELETS: 265 10*3/uL (ref 150–400)
RBC: 4.83 MIL/uL (ref 3.87–5.11)
RBC: 4.97 MIL/uL (ref 3.87–5.11)
RDW: 13.8 % (ref 11.5–15.5)
RDW: 13.9 % (ref 11.5–15.5)
WBC: 7.5 10*3/uL (ref 4.0–10.5)
WBC: 7.5 10*3/uL (ref 4.0–10.5)
nRBC: 0 % (ref 0.0–0.2)

## 2017-11-03 LAB — ECHOCARDIOGRAM COMPLETE

## 2017-11-03 LAB — BASIC METABOLIC PANEL
ANION GAP: 9 (ref 5–15)
BUN: 24 mg/dL — AB (ref 8–23)
CALCIUM: 9.8 mg/dL (ref 8.9–10.3)
CO2: 26 mmol/L (ref 22–32)
CREATININE: 1.34 mg/dL — AB (ref 0.44–1.00)
Chloride: 105 mmol/L (ref 98–111)
GFR calc Af Amer: 48 mL/min — ABNORMAL LOW (ref >60)
GFR, EST NON AFRICAN AMERICAN: 41 mL/min — AB (ref >60)
GLUCOSE: 95 mg/dL (ref 70–99)
Potassium: 3.3 mmol/L — ABNORMAL LOW (ref 3.5–5.1)
Sodium: 140 mmol/L (ref 135–145)

## 2017-11-03 LAB — CREATININE, SERUM
Creatinine, Ser: 1.36 mg/dL — ABNORMAL HIGH (ref 0.44–1.00)
GFR, EST AFRICAN AMERICAN: 47 mL/min — AB (ref >60)
GFR, EST NON AFRICAN AMERICAN: 41 mL/min — AB (ref >60)

## 2017-11-03 LAB — MAGNESIUM: Magnesium: 2 mg/dL (ref 1.7–2.4)

## 2017-11-03 LAB — TSH: TSH: 4.074 u[IU]/mL (ref 0.350–4.500)

## 2017-11-03 MED ORDER — ACETAMINOPHEN 325 MG PO TABS: 650.0000 mg | ORAL_TABLET | Freq: Four times a day (QID) | ORAL | Status: DC | PRN

## 2017-11-03 MED ORDER — POTASSIUM CHLORIDE CRYS ER 20 MEQ PO TBCR
40.0000 meq | EXTENDED_RELEASE_TABLET | Freq: Once | ORAL | Status: AC
  Administered 2017-11-03: 40 meq via ORAL
  Filled 2017-11-03: qty 2

## 2017-11-03 NOTE — Progress Notes (Signed)
  Echocardiogram 2D Echocardiogram has been performed.  Donna Castaneda 11/03/2017, 12:15 PM

## 2017-11-03 NOTE — Discharge Summary (Addendum)
Physician Discharge Summary  Donna Castaneda YDX:[id number] DOB: 1955-05-07 DOA: 11/02/2017  PCP: Greig Right, MD  Admit date: 11/02/2017 Discharge date: 11/03/2017  Admitted From: home Discharge disposition: home   Recommendations for Outpatient Follow-Up:   1. Pending labs: plasma meta, aldosterone+renin ratio 2. If BP continues to be an issue-- outpatient renal U/S 3. BMP 1 week 4. BP medications adjusted 5. D/c ibuprofen   Discharge Diagnosis:   Active Problems:   HTN (hypertension)   Obesity    Discharge Condition: Improved.  Diet recommendation: Low sodium, heart healthy  Wound care: None.  Code status: Full.   History of Present Illness:   Donna Castaneda is a 62 y.o. female with HTN, obesity, bipolar disorder, and pre-diabetes who presents with syncopal episode. Since around June, she has been diagnosed with severe HTN (prior baseline BP were in 720N systolics but jumped to [id number] at outside clinic). Since then, her PCP has been adding anti-HTN agents (taking total 5 agents) including clonidine and amlodipine in addition to her previous regimen. Her prior BP regimen includes losartan, atenolol, and HCTZ. Ever since being prescribed amlodipine last week, patient has noted dizzy spells. Patient uses a vitals tracker on her iPad which has documented BP as low as 110/80 with associated severe dizzy spell that occurred after taking the amlodipine while she was a passenger in the car. She does not recall clear orthostasis sx or dizziness prior to the syncopal event on day of admission -- she had been ambulating from the kitchen to the living room; but she does note having lightheadedness pretty much every day since starting amlodipine. Denies prior cardiac history or renal dysfunction.  Note: patient resides in [place] and sees PCP there (Dr. Burnett Sheng). No prior secondary HTN workup .     Hospital Course by Problem:   Severe HTN diagnosed ~ 1-2 months  ago. On multiple anti-HTN agents as above             - renal doppler as an outpatient             - echo: - Left ventricle: The cavity size was normal. Wall thickness was at   the upper limits of normal. Systolic function was normal. The   estimated ejection fraction was in the range of 60% to 65%. Wall   motion was normal; there were no regional wall motion   abnormalities. Doppler parameters are consistent with abnormal   left ventricular relaxation (grade 1 diastolic dysfunction). - Left atrium: The atrium was mildly to moderately dilated. -  renin-aldos; plasma metanephrines pending - d/c BB secondary to low HR and dizziness - recommend outpatient sleep study -d/c ibuprofen -d/c HCTZ for now -continue ARB/clonodine (BP in hospital controlled on these meds   AKI Cr 1.6 with unknown baseline. No prior knowledge of kidney issues per patient.  -outpatient follow up  Hypokalemia K 3.4 -replete -d/c HCTZ for now  Hx of chronic bipolar do - resume home seroquel 100mg  nightly - resume home bupropion XL 300mg  nightly  Obesity Encourage weight loss   Medical Consultants:      Discharge Exam:   Vitals:   11/03/17 1300 11/03/17 1613  BP: (!) 124/49   Pulse: (!) 55 61  Resp: 18 16  Temp: (!) 97.4 F (36.3 C) 97.6 F (36.4 C)  SpO2:  97%   Vitals:   11/02/17 2334 11/03/17 0611 11/03/17 1300 11/03/17 1613  BP: (!) 144/87 (!) 125/55 (!) 124/49  Pulse: (!) 57 63 (!) 55 61  Resp: 16 18 18 16   Temp: 98.2 F (36.8 C) 98 F (36.7 C) (!) 97.4 F (36.3 C) 97.6 F (36.4 C)  TempSrc: Oral Oral Oral Oral  SpO2: 99% 98%  97%    General exam: Appears calm and comfortable..    The results of significant diagnostics from this hospitalization (including imaging, microbiology, ancillary and laboratory) are listed below for reference.     Procedures and Diagnostic Studies:   Ct Head Wo Contrast  Result Date: 11/02/2017 CLINICAL DATA:  Hypertension, headache and  dizziness over the last several weeks. EXAM: CT HEAD WITHOUT CONTRAST TECHNIQUE: Contiguous axial images were obtained from the base of the skull through the vertex without intravenous contrast. COMPARISON:  Brain MRI, 04/11/2010 FINDINGS: Brain: No evidence of acute infarction, hemorrhage, hydrocephalus, extra-axial collection or mass lesion/mass effect. Vascular: No hyperdense vessel or unexpected calcification. Skull: Normal. Negative for fracture or focal lesion. Sinuses/Orbits: Visualized globes and orbits are within normal limits. Visualized sinuses and mastoid air cells are clear. Other: None. IMPRESSION: Normal unenhanced CT scan of the brain. Electronically Signed   By: Lajean Manes M.D.   On: 11/02/2017 18:26     Labs:   Basic Metabolic Panel: Recent Labs  Lab 11/02/17 1633 11/02/17 1719 11/02/17 2307 11/03/17 0003  NA 139 140  --  140  K 3.5 3.4*  --  3.3*  CL 103 102  --  105  CO2 26  --   --  26  GLUCOSE 119* 110*  --  95  BUN 28* 31*  --  24*  CREATININE 1.52* 1.60* 1.36* 1.34*  CALCIUM 9.8  --   --  9.8  MG  --   --   --  2.0   GFR CrCl cannot be calculated (Unknown ideal weight.). Liver Function Tests: Recent Labs  Lab 11/02/17 1633  AST 22  ALT 25  ALKPHOS 100  BILITOT 0.7  PROT 7.1  ALBUMIN 4.2   No results for input(s): LIPASE, AMYLASE in the last 168 hours. No results for input(s): AMMONIA in the last 168 hours. Coagulation profile Recent Labs  Lab 11/02/17 1633  INR 1.00    CBC: Recent Labs  Lab 11/02/17 1633 11/02/17 1719 11/02/17 2307 11/03/17 0003  WBC 6.8  --  7.5 7.5  NEUTROABS 3.8  --   --   --   HGB 14.4 15.6* 14.0 14.2  HCT 46.2* 46.0 45.4 43.9  MCV 92.6  --  91.3 90.9  PLT 280  --  265 265   Cardiac Enzymes: No results for input(s): CKTOTAL, CKMB, CKMBINDEX, TROPONINI in the last 168 hours. BNP: Invalid input(s): POCBNP CBG: No results for input(s): GLUCAP in the last 168 hours. D-Dimer No results for input(s): DDIMER in  the last 72 hours. Hgb A1c No results for input(s): HGBA1C in the last 72 hours. Lipid Profile No results for input(s): CHOL, HDL, LDLCALC, TRIG, CHOLHDL, LDLDIRECT in the last 72 hours. Thyroid function studies Recent Labs    11/02/17 2334  TSH 4.074   Anemia work up No results for input(s): VITAMINB12, FOLATE, FERRITIN, TIBC, IRON, RETICCTPCT in the last 72 hours. Microbiology No results found for this or any previous visit (from the past 240 hour(s)).   Discharge Instructions:   Discharge Instructions    Diet - low sodium heart healthy   Complete by:  As directed    Discharge instructions   Complete by:  As directed  Non-steroidal Anti-inflammatory Drugs (NSAIDs) This may cause your blood pressure to rise even higher, putting greater stress on your heart and kidneys. NSAIDs can also raise your risk for heart attack or stroke, especially in higher doses. Common NSAIDs that can raise blood pressure include: Ibuprofen (Advil, Motrin)   Increase activity slowly   Complete by:  As directed      Allergies as of 11/03/2017      Reactions   Amlodipine Other (See Comments)   Vertigo- rendered patient unable to drive   Benicar [olmesartan] Other (See Comments)   Cause IBS-like symptoms   Shrimp [shellfish Allergy] Other (See Comments)   Limited intake due to issue with gout      Medication List    STOP taking these medications   atenolol 50 MG tablet Commonly known as:  TENORMIN   hydrochlorothiazide 25 MG tablet Commonly known as:  HYDRODIURIL   ibuprofen 200 MG tablet Commonly known as:  ADVIL,MOTRIN   meloxicam 15 MG tablet Commonly known as:  MOBIC     TAKE these medications   acetaminophen 325 MG tablet Commonly known as:  TYLENOL Take 2 tablets (650 mg total) by mouth every 6 (six) hours as needed for mild pain (or Fever >/= 101).   buPROPion 300 MG 24 hr tablet Commonly known as:  WELLBUTRIN XL Take 300 mg by mouth daily.   cloNIDine 0.1 MG  tablet Commonly known as:  CATAPRES Take 0.1 mg by mouth See admin instructions. Take 0.1 mg by mouth in the morning and an additional 0.1 mg at suppertime if Systolic reading is 101 or greater   dicyclomine 10 MG capsule Commonly known as:  BENTYL Take 10 mg by mouth 4 (four) times daily as needed for spasms.   losartan 100 MG tablet Commonly known as:  COZAAR Take 100 mg by mouth daily.   metFORMIN 500 MG 24 hr tablet Commonly known as:  GLUCOPHAGE-XR Take 500 mg by mouth daily with supper.   QUEtiapine 100 MG tablet Commonly known as:  SEROQUEL Take 100 mg by mouth at bedtime.   VISINE-AC 0.05-0.25 % ophthalmic solution Generic drug:  tetrahydrozoline-zinc Place 2 drops into both eyes 3 (three) times daily as needed (for redness or irritation).      Follow-up Information    Greig Right, MD Follow up in 1 week(s).   Specialty:  Family Medicine Why:  for BP check Contact information: [place] [address] [phone]            Time coordinating discharge: 35 min  Signed:  Geradine Girt  [facility] 11/03/2017, 4:40 PM

## 2017-11-03 NOTE — Plan of Care (Signed)
Pt alert, oriented, and in no apparent distress at this time. Pt admitted to unit, assessed, and oriented to room & floor. Pt comfortable, pain free, and not complaining of any weakness or dizziness. Progressing positively toward discharge.

## 2017-11-06 DIAGNOSIS — I10 Essential (primary) hypertension: Secondary | ICD-10-CM | POA: Diagnosis not present

## 2017-11-06 DIAGNOSIS — R55 Syncope and collapse: Secondary | ICD-10-CM | POA: Diagnosis not present

## 2017-11-06 DIAGNOSIS — Z6841 Body Mass Index (BMI) 40.0 and over, adult: Secondary | ICD-10-CM | POA: Diagnosis not present

## 2017-11-06 LAB — METANEPHRINES, PLASMA
Metanephrine, Free: 13 pg/mL (ref 0–62)
NORMETANEPHRINE FREE: 92 pg/mL (ref 0–145)

## 2017-11-08 LAB — ALDOSTERONE + RENIN ACTIVITY W/ RATIO
ALDO / PRA Ratio: 15.6 (ref 0.0–30.0)
Aldosterone: 7.1 ng/dL (ref 0.0–30.0)
PRA LC/MS/MS: 0.456 ng/mL/h (ref 0.167–5.380)

## 2017-11-11 ENCOUNTER — Other Ambulatory Visit: Payer: Self-pay

## 2017-11-11 NOTE — Patient Outreach (Signed)
[facility] Care Management  [place]  11/11/2017  SHANNEN FLANSBURG [birth year] [id number]   62 year old female outreached by [facility] for a 30 day post discharge medication review.  PMHx includes, but not limited to, hypertension, bipolar, prediabetes and obesity.  Unsuccessful telephone call attempt # 1 to patient.   HIPAA compliant voicemail left requesting a return call.   Plan:  Outreach attempt #2 in 3-4 business days.  Joetta Manners, PharmD Clinical Pharmacist [place] [phone]

## 2017-11-14 DIAGNOSIS — Z0000 Encounter for general adult medical examination without abnormal findings: Secondary | ICD-10-CM | POA: Diagnosis not present

## 2017-11-14 DIAGNOSIS — Z90710 Acquired absence of both cervix and uterus: Secondary | ICD-10-CM | POA: Diagnosis not present

## 2017-11-14 DIAGNOSIS — I10 Essential (primary) hypertension: Secondary | ICD-10-CM | POA: Diagnosis not present

## 2017-11-14 DIAGNOSIS — Z1211 Encounter for screening for malignant neoplasm of colon: Secondary | ICD-10-CM | POA: Diagnosis not present

## 2017-11-14 DIAGNOSIS — F3170 Bipolar disorder, currently in remission, most recent episode unspecified: Secondary | ICD-10-CM | POA: Diagnosis not present

## 2017-11-14 DIAGNOSIS — Z6841 Body Mass Index (BMI) 40.0 and over, adult: Secondary | ICD-10-CM | POA: Diagnosis not present

## 2017-11-15 ENCOUNTER — Other Ambulatory Visit: Payer: Self-pay

## 2017-11-15 ENCOUNTER — Ambulatory Visit: Payer: Self-pay

## 2017-11-15 NOTE — Patient Outreach (Signed)
[facility]) Care Management  [place]  11/15/2017  Donna Castaneda [birth year] [id number]   Reason for referral: post discharge medication review  Unsuccessful telephone call attempt # 2 to patient.   HIPAA compliant voicemail left requesting a return call.  Plan:  I will make another outreach attempt to patient within 3-4 business days.  Joetta Manners, PharmD Clinical Pharmacist [place] [phone]

## 2017-11-20 ENCOUNTER — Other Ambulatory Visit: Payer: Self-pay

## 2017-11-20 NOTE — Patient Outreach (Signed)
[facility]) Care Management  [place]  11/20/2017  SHILYNN HOCH 1955-07-27 [id number]   Reason for referral: post discharge medication review  Unsuccessful telephone call attempt # 3 to patient.   HIPAA compliant voicemail left requesting a return call  Plan:  I will follow-up on 10th business day from first unsuccessful outreach attempt to maintain contact with patient. If no response from patient at this time, I will close [facility] case.   Joetta Manners, PharmD Clinical Pharmacist [place] [phone]

## 2017-11-21 ENCOUNTER — Ambulatory Visit: Payer: Self-pay

## 2017-11-25 ENCOUNTER — Other Ambulatory Visit: Payer: Self-pay

## 2017-11-25 NOTE — Patient Outreach (Signed)
[facility]) Care Management [place]  11/25/2017  VERDEAN MURIN [birth year] [id number]  Reason for referral: post discharge medication review  [facility] pharmacy case is being closed due to the following reasons:  We have been unable to establish and/or maintain contact with the patient.  Patient has been provided [facility] CM contact information if assistance needed in the future.    Thank you for allowing [facility] pharmacy to be involved in this patient's care.    Joetta Manners, PharmD Clinical Pharmacist [place] [phone]

## 2017-11-26 DIAGNOSIS — Z13220 Encounter for screening for lipoid disorders: Secondary | ICD-10-CM | POA: Diagnosis not present

## 2017-11-26 DIAGNOSIS — Z6841 Body Mass Index (BMI) 40.0 and over, adult: Secondary | ICD-10-CM | POA: Diagnosis not present

## 2017-11-26 DIAGNOSIS — F319 Bipolar disorder, unspecified: Secondary | ICD-10-CM | POA: Diagnosis not present

## 2017-11-26 DIAGNOSIS — E039 Hypothyroidism, unspecified: Secondary | ICD-10-CM | POA: Diagnosis not present

## 2017-11-26 DIAGNOSIS — Z131 Encounter for screening for diabetes mellitus: Secondary | ICD-10-CM | POA: Diagnosis not present

## 2017-11-26 DIAGNOSIS — I10 Essential (primary) hypertension: Secondary | ICD-10-CM | POA: Diagnosis not present

## 2017-12-02 DIAGNOSIS — I10 Essential (primary) hypertension: Secondary | ICD-10-CM | POA: Diagnosis not present

## 2017-12-02 DIAGNOSIS — E119 Type 2 diabetes mellitus without complications: Secondary | ICD-10-CM | POA: Diagnosis not present

## 2017-12-02 DIAGNOSIS — Z6841 Body Mass Index (BMI) 40.0 and over, adult: Secondary | ICD-10-CM | POA: Diagnosis not present

## 2018-02-04 DIAGNOSIS — I10 Essential (primary) hypertension: Secondary | ICD-10-CM | POA: Diagnosis not present

## 2018-02-04 DIAGNOSIS — E782 Mixed hyperlipidemia: Secondary | ICD-10-CM | POA: Diagnosis not present

## 2018-02-04 DIAGNOSIS — E119 Type 2 diabetes mellitus without complications: Secondary | ICD-10-CM | POA: Diagnosis not present

## 2018-02-04 DIAGNOSIS — Z6841 Body Mass Index (BMI) 40.0 and over, adult: Secondary | ICD-10-CM | POA: Diagnosis not present

## 2018-02-28 DIAGNOSIS — H2513 Age-related nuclear cataract, bilateral: Secondary | ICD-10-CM | POA: Diagnosis not present

## 2018-02-28 DIAGNOSIS — H40033 Anatomical narrow angle, bilateral: Secondary | ICD-10-CM | POA: Diagnosis not present

## 2018-03-01 DIAGNOSIS — Z1211 Encounter for screening for malignant neoplasm of colon: Secondary | ICD-10-CM | POA: Diagnosis not present

## 2018-05-06 DIAGNOSIS — E1169 Type 2 diabetes mellitus with other specified complication: Secondary | ICD-10-CM | POA: Diagnosis not present

## 2018-05-06 DIAGNOSIS — K582 Mixed irritable bowel syndrome: Secondary | ICD-10-CM | POA: Diagnosis not present

## 2018-05-06 DIAGNOSIS — F319 Bipolar disorder, unspecified: Secondary | ICD-10-CM | POA: Diagnosis not present

## 2018-05-06 DIAGNOSIS — E039 Hypothyroidism, unspecified: Secondary | ICD-10-CM | POA: Diagnosis not present

## 2018-05-06 DIAGNOSIS — I10 Essential (primary) hypertension: Secondary | ICD-10-CM | POA: Diagnosis not present

## 2018-07-01 DIAGNOSIS — K116 Mucocele of salivary gland: Secondary | ICD-10-CM

## 2018-07-01 HISTORY — DX: Mucocele of salivary gland: K11.6

## 2018-07-28 DIAGNOSIS — E669 Obesity, unspecified: Secondary | ICD-10-CM | POA: Diagnosis not present

## 2018-07-28 DIAGNOSIS — Z6837 Body mass index (BMI) 37.0-37.9, adult: Secondary | ICD-10-CM | POA: Diagnosis not present

## 2018-07-28 DIAGNOSIS — F3170 Bipolar disorder, currently in remission, most recent episode unspecified: Secondary | ICD-10-CM | POA: Diagnosis not present

## 2018-07-28 DIAGNOSIS — I10 Essential (primary) hypertension: Secondary | ICD-10-CM | POA: Diagnosis not present

## 2018-07-28 DIAGNOSIS — E1169 Type 2 diabetes mellitus with other specified complication: Secondary | ICD-10-CM | POA: Diagnosis not present

## 2018-08-10 DIAGNOSIS — H9209 Otalgia, unspecified ear: Secondary | ICD-10-CM | POA: Diagnosis not present

## 2018-08-10 DIAGNOSIS — N3000 Acute cystitis without hematuria: Secondary | ICD-10-CM | POA: Diagnosis not present

## 2018-09-03 DIAGNOSIS — F411 Generalized anxiety disorder: Secondary | ICD-10-CM | POA: Diagnosis not present

## 2018-10-20 DIAGNOSIS — S90121A Contusion of right lesser toe(s) without damage to nail, initial encounter: Secondary | ICD-10-CM | POA: Diagnosis not present

## 2018-10-27 DIAGNOSIS — Z1231 Encounter for screening mammogram for malignant neoplasm of breast: Secondary | ICD-10-CM | POA: Diagnosis not present

## 2018-10-27 DIAGNOSIS — I10 Essential (primary) hypertension: Secondary | ICD-10-CM | POA: Diagnosis not present

## 2018-10-27 DIAGNOSIS — E1169 Type 2 diabetes mellitus with other specified complication: Secondary | ICD-10-CM | POA: Diagnosis not present

## 2018-10-27 DIAGNOSIS — Z6837 Body mass index (BMI) 37.0-37.9, adult: Secondary | ICD-10-CM | POA: Diagnosis not present

## 2018-10-27 DIAGNOSIS — S90121A Contusion of right lesser toe(s) without damage to nail, initial encounter: Secondary | ICD-10-CM | POA: Diagnosis not present

## 2018-10-27 DIAGNOSIS — E669 Obesity, unspecified: Secondary | ICD-10-CM | POA: Diagnosis not present

## 2018-10-27 DIAGNOSIS — F3170 Bipolar disorder, currently in remission, most recent episode unspecified: Secondary | ICD-10-CM | POA: Diagnosis not present

## 2018-10-27 DIAGNOSIS — Z1211 Encounter for screening for malignant neoplasm of colon: Secondary | ICD-10-CM | POA: Diagnosis not present

## 2018-11-18 DIAGNOSIS — M1A9XX0 Chronic gout, unspecified, without tophus (tophi): Secondary | ICD-10-CM | POA: Diagnosis not present

## 2018-12-10 ENCOUNTER — Other Ambulatory Visit: Payer: Self-pay | Admitting: Family Medicine

## 2018-12-10 DIAGNOSIS — Z1231 Encounter for screening mammogram for malignant neoplasm of breast: Secondary | ICD-10-CM

## 2019-01-29 DIAGNOSIS — F3170 Bipolar disorder, currently in remission, most recent episode unspecified: Secondary | ICD-10-CM | POA: Diagnosis not present

## 2019-01-29 DIAGNOSIS — Z90710 Acquired absence of both cervix and uterus: Secondary | ICD-10-CM | POA: Diagnosis not present

## 2019-01-29 DIAGNOSIS — Z0000 Encounter for general adult medical examination without abnormal findings: Secondary | ICD-10-CM | POA: Diagnosis not present

## 2019-01-29 DIAGNOSIS — Z9181 History of falling: Secondary | ICD-10-CM | POA: Diagnosis not present

## 2019-01-29 DIAGNOSIS — E669 Obesity, unspecified: Secondary | ICD-10-CM | POA: Diagnosis not present

## 2019-01-29 DIAGNOSIS — M1A9XX0 Chronic gout, unspecified, without tophus (tophi): Secondary | ICD-10-CM | POA: Diagnosis not present

## 2019-01-29 DIAGNOSIS — I10 Essential (primary) hypertension: Secondary | ICD-10-CM | POA: Diagnosis not present

## 2019-01-29 DIAGNOSIS — E1169 Type 2 diabetes mellitus with other specified complication: Secondary | ICD-10-CM | POA: Diagnosis not present

## 2019-01-29 DIAGNOSIS — Z6837 Body mass index (BMI) 37.0-37.9, adult: Secondary | ICD-10-CM | POA: Diagnosis not present

## 2019-01-29 DIAGNOSIS — Z23 Encounter for immunization: Secondary | ICD-10-CM | POA: Diagnosis not present

## 2019-03-03 DIAGNOSIS — Z0000 Encounter for general adult medical examination without abnormal findings: Secondary | ICD-10-CM | POA: Diagnosis not present

## 2019-03-03 DIAGNOSIS — E1169 Type 2 diabetes mellitus with other specified complication: Secondary | ICD-10-CM | POA: Diagnosis not present

## 2019-03-03 DIAGNOSIS — Z6837 Body mass index (BMI) 37.0-37.9, adult: Secondary | ICD-10-CM | POA: Diagnosis not present

## 2019-03-03 DIAGNOSIS — M1A9XX0 Chronic gout, unspecified, without tophus (tophi): Secondary | ICD-10-CM | POA: Diagnosis not present

## 2019-03-03 DIAGNOSIS — Z79899 Other long term (current) drug therapy: Secondary | ICD-10-CM | POA: Diagnosis not present

## 2019-03-03 DIAGNOSIS — I10 Essential (primary) hypertension: Secondary | ICD-10-CM | POA: Diagnosis not present

## 2019-03-03 DIAGNOSIS — F3170 Bipolar disorder, currently in remission, most recent episode unspecified: Secondary | ICD-10-CM | POA: Diagnosis not present

## 2019-03-03 DIAGNOSIS — E669 Obesity, unspecified: Secondary | ICD-10-CM | POA: Diagnosis not present

## 2019-06-03 DIAGNOSIS — N1832 Chronic kidney disease, stage 3b: Secondary | ICD-10-CM | POA: Diagnosis not present

## 2019-06-03 DIAGNOSIS — R072 Precordial pain: Secondary | ICD-10-CM | POA: Diagnosis not present

## 2019-06-03 DIAGNOSIS — E1169 Type 2 diabetes mellitus with other specified complication: Secondary | ICD-10-CM | POA: Diagnosis not present

## 2019-06-03 DIAGNOSIS — Z6837 Body mass index (BMI) 37.0-37.9, adult: Secondary | ICD-10-CM | POA: Diagnosis not present

## 2019-06-03 DIAGNOSIS — I10 Essential (primary) hypertension: Secondary | ICD-10-CM | POA: Diagnosis not present

## 2019-06-03 DIAGNOSIS — Z1231 Encounter for screening mammogram for malignant neoplasm of breast: Secondary | ICD-10-CM | POA: Diagnosis not present

## 2019-08-27 DIAGNOSIS — M791 Myalgia, unspecified site: Secondary | ICD-10-CM | POA: Diagnosis not present

## 2019-08-27 DIAGNOSIS — F3170 Bipolar disorder, currently in remission, most recent episode unspecified: Secondary | ICD-10-CM | POA: Diagnosis not present

## 2019-08-27 DIAGNOSIS — E1169 Type 2 diabetes mellitus with other specified complication: Secondary | ICD-10-CM | POA: Diagnosis not present

## 2019-08-27 DIAGNOSIS — I10 Essential (primary) hypertension: Secondary | ICD-10-CM | POA: Diagnosis not present

## 2019-09-03 DIAGNOSIS — S6991XA Unspecified injury of right wrist, hand and finger(s), initial encounter: Secondary | ICD-10-CM | POA: Diagnosis not present

## 2019-09-03 DIAGNOSIS — M79644 Pain in right finger(s): Secondary | ICD-10-CM | POA: Diagnosis not present

## 2019-10-28 DIAGNOSIS — Z1211 Encounter for screening for malignant neoplasm of colon: Secondary | ICD-10-CM | POA: Diagnosis not present

## 2019-10-28 DIAGNOSIS — Z1212 Encounter for screening for malignant neoplasm of rectum: Secondary | ICD-10-CM | POA: Diagnosis not present

## 2019-11-26 DIAGNOSIS — F319 Bipolar disorder, unspecified: Secondary | ICD-10-CM | POA: Diagnosis not present

## 2019-11-26 DIAGNOSIS — I10 Essential (primary) hypertension: Secondary | ICD-10-CM | POA: Diagnosis not present

## 2019-11-26 DIAGNOSIS — K582 Mixed irritable bowel syndrome: Secondary | ICD-10-CM | POA: Diagnosis not present

## 2019-11-26 DIAGNOSIS — E7800 Pure hypercholesterolemia, unspecified: Secondary | ICD-10-CM | POA: Diagnosis not present

## 2019-11-26 DIAGNOSIS — Z7189 Other specified counseling: Secondary | ICD-10-CM | POA: Diagnosis not present

## 2019-11-26 DIAGNOSIS — E782 Mixed hyperlipidemia: Secondary | ICD-10-CM | POA: Diagnosis not present

## 2019-11-26 DIAGNOSIS — E119 Type 2 diabetes mellitus without complications: Secondary | ICD-10-CM | POA: Diagnosis not present

## 2019-11-26 DIAGNOSIS — E669 Obesity, unspecified: Secondary | ICD-10-CM | POA: Diagnosis not present

## 2019-11-26 DIAGNOSIS — Z23 Encounter for immunization: Secondary | ICD-10-CM | POA: Diagnosis not present

## 2020-01-04 DIAGNOSIS — M940 Chondrocostal junction syndrome [Tietze]: Secondary | ICD-10-CM | POA: Diagnosis not present

## 2020-01-21 DIAGNOSIS — E119 Type 2 diabetes mellitus without complications: Secondary | ICD-10-CM | POA: Diagnosis not present

## 2020-01-21 DIAGNOSIS — H25813 Combined forms of age-related cataract, bilateral: Secondary | ICD-10-CM | POA: Diagnosis not present

## 2020-01-21 DIAGNOSIS — H527 Unspecified disorder of refraction: Secondary | ICD-10-CM | POA: Diagnosis not present

## 2020-02-11 DIAGNOSIS — H527 Unspecified disorder of refraction: Secondary | ICD-10-CM | POA: Diagnosis not present

## 2020-02-12 DIAGNOSIS — S0501XA Injury of conjunctiva and corneal abrasion without foreign body, right eye, initial encounter: Secondary | ICD-10-CM | POA: Diagnosis not present

## 2020-04-08 DIAGNOSIS — I129 Hypertensive chronic kidney disease with stage 1 through stage 4 chronic kidney disease, or unspecified chronic kidney disease: Secondary | ICD-10-CM | POA: Diagnosis not present

## 2020-04-08 DIAGNOSIS — M1000 Idiopathic gout, unspecified site: Secondary | ICD-10-CM | POA: Diagnosis not present

## 2020-04-08 DIAGNOSIS — N189 Chronic kidney disease, unspecified: Secondary | ICD-10-CM | POA: Diagnosis not present

## 2020-04-08 DIAGNOSIS — E039 Hypothyroidism, unspecified: Secondary | ICD-10-CM | POA: Diagnosis not present

## 2020-04-08 DIAGNOSIS — I10 Essential (primary) hypertension: Secondary | ICD-10-CM | POA: Diagnosis not present

## 2020-04-08 DIAGNOSIS — E1122 Type 2 diabetes mellitus with diabetic chronic kidney disease: Secondary | ICD-10-CM | POA: Diagnosis not present

## 2020-04-08 DIAGNOSIS — E1169 Type 2 diabetes mellitus with other specified complication: Secondary | ICD-10-CM | POA: Diagnosis not present

## 2020-04-08 DIAGNOSIS — E782 Mixed hyperlipidemia: Secondary | ICD-10-CM | POA: Diagnosis not present

## 2020-04-13 DIAGNOSIS — E1169 Type 2 diabetes mellitus with other specified complication: Secondary | ICD-10-CM | POA: Diagnosis not present

## 2020-04-13 DIAGNOSIS — E782 Mixed hyperlipidemia: Secondary | ICD-10-CM | POA: Diagnosis not present

## 2020-04-13 DIAGNOSIS — I10 Essential (primary) hypertension: Secondary | ICD-10-CM | POA: Diagnosis not present

## 2020-04-13 DIAGNOSIS — Z7189 Other specified counseling: Secondary | ICD-10-CM | POA: Diagnosis not present

## 2020-04-13 DIAGNOSIS — Z0000 Encounter for general adult medical examination without abnormal findings: Secondary | ICD-10-CM | POA: Diagnosis not present

## 2020-04-27 DIAGNOSIS — E1169 Type 2 diabetes mellitus with other specified complication: Secondary | ICD-10-CM | POA: Diagnosis not present

## 2020-05-02 DIAGNOSIS — I10 Essential (primary) hypertension: Secondary | ICD-10-CM | POA: Diagnosis not present

## 2020-05-02 DIAGNOSIS — E782 Mixed hyperlipidemia: Secondary | ICD-10-CM | POA: Diagnosis not present

## 2020-05-02 DIAGNOSIS — Z7189 Other specified counseling: Secondary | ICD-10-CM | POA: Diagnosis not present

## 2020-05-02 DIAGNOSIS — E119 Type 2 diabetes mellitus without complications: Secondary | ICD-10-CM | POA: Diagnosis not present

## 2020-05-24 DIAGNOSIS — M7521 Bicipital tendinitis, right shoulder: Secondary | ICD-10-CM | POA: Diagnosis not present

## 2020-05-24 DIAGNOSIS — S40261A Insect bite (nonvenomous) of right shoulder, initial encounter: Secondary | ICD-10-CM | POA: Diagnosis not present

## 2020-05-27 DIAGNOSIS — I10 Essential (primary) hypertension: Secondary | ICD-10-CM | POA: Diagnosis not present

## 2020-05-27 DIAGNOSIS — I129 Hypertensive chronic kidney disease with stage 1 through stage 4 chronic kidney disease, or unspecified chronic kidney disease: Secondary | ICD-10-CM | POA: Diagnosis not present

## 2020-05-27 DIAGNOSIS — E1122 Type 2 diabetes mellitus with diabetic chronic kidney disease: Secondary | ICD-10-CM | POA: Diagnosis not present

## 2020-05-27 DIAGNOSIS — M1000 Idiopathic gout, unspecified site: Secondary | ICD-10-CM | POA: Diagnosis not present

## 2020-05-31 DIAGNOSIS — Z7189 Other specified counseling: Secondary | ICD-10-CM | POA: Diagnosis not present

## 2020-05-31 DIAGNOSIS — I129 Hypertensive chronic kidney disease with stage 1 through stage 4 chronic kidney disease, or unspecified chronic kidney disease: Secondary | ICD-10-CM | POA: Diagnosis not present

## 2020-05-31 DIAGNOSIS — I10 Essential (primary) hypertension: Secondary | ICD-10-CM | POA: Diagnosis not present

## 2020-05-31 DIAGNOSIS — E782 Mixed hyperlipidemia: Secondary | ICD-10-CM | POA: Diagnosis not present

## 2020-07-13 DIAGNOSIS — E559 Vitamin D deficiency, unspecified: Secondary | ICD-10-CM | POA: Diagnosis not present

## 2020-07-13 DIAGNOSIS — E782 Mixed hyperlipidemia: Secondary | ICD-10-CM | POA: Diagnosis not present

## 2020-07-13 DIAGNOSIS — E119 Type 2 diabetes mellitus without complications: Secondary | ICD-10-CM | POA: Diagnosis not present

## 2020-07-13 DIAGNOSIS — D518 Other vitamin B12 deficiency anemias: Secondary | ICD-10-CM | POA: Diagnosis not present

## 2020-07-13 DIAGNOSIS — M25511 Pain in right shoulder: Secondary | ICD-10-CM | POA: Diagnosis not present

## 2020-07-13 DIAGNOSIS — E038 Other specified hypothyroidism: Secondary | ICD-10-CM | POA: Diagnosis not present

## 2020-07-13 DIAGNOSIS — I10 Essential (primary) hypertension: Secondary | ICD-10-CM | POA: Diagnosis not present

## 2020-07-14 DIAGNOSIS — E785 Hyperlipidemia, unspecified: Secondary | ICD-10-CM | POA: Diagnosis not present

## 2020-07-18 DIAGNOSIS — E119 Type 2 diabetes mellitus without complications: Secondary | ICD-10-CM | POA: Diagnosis not present

## 2020-07-27 DIAGNOSIS — E119 Type 2 diabetes mellitus without complications: Secondary | ICD-10-CM | POA: Diagnosis not present

## 2020-07-28 ENCOUNTER — Ambulatory Visit: Payer: PPO | Admitting: Skilled Nursing Facility1

## 2020-07-29 DIAGNOSIS — Z1231 Encounter for screening mammogram for malignant neoplasm of breast: Secondary | ICD-10-CM | POA: Diagnosis not present

## 2020-08-02 DIAGNOSIS — N189 Chronic kidney disease, unspecified: Secondary | ICD-10-CM | POA: Diagnosis not present

## 2020-08-02 DIAGNOSIS — E785 Hyperlipidemia, unspecified: Secondary | ICD-10-CM | POA: Diagnosis not present

## 2020-08-02 DIAGNOSIS — M19011 Primary osteoarthritis, right shoulder: Secondary | ICD-10-CM | POA: Diagnosis not present

## 2020-08-02 DIAGNOSIS — E559 Vitamin D deficiency, unspecified: Secondary | ICD-10-CM | POA: Diagnosis not present

## 2020-08-02 DIAGNOSIS — E119 Type 2 diabetes mellitus without complications: Secondary | ICD-10-CM | POA: Diagnosis not present

## 2020-09-06 ENCOUNTER — Other Ambulatory Visit: Payer: Self-pay | Admitting: Internal Medicine

## 2020-09-06 DIAGNOSIS — N1831 Chronic kidney disease, stage 3a: Secondary | ICD-10-CM

## 2020-09-21 ENCOUNTER — Ambulatory Visit
Admission: RE | Admit: 2020-09-21 | Discharge: 2020-09-21 | Disposition: A | Discharge status: Home / Self Care | Payer: Medicare Other | Source: Ambulatory Visit | Attending: Internal Medicine | Admitting: Internal Medicine

## 2020-09-21 DIAGNOSIS — N1831 Chronic kidney disease, stage 3a: Secondary | ICD-10-CM

## 2020-09-28 ENCOUNTER — Other Ambulatory Visit: Payer: Self-pay

## 2020-09-28 ENCOUNTER — Ambulatory Visit: Payer: Medicare Other | Admitting: Sports Medicine

## 2020-09-28 ENCOUNTER — Other Ambulatory Visit: Payer: Self-pay | Admitting: Sports Medicine

## 2020-09-28 ENCOUNTER — Ambulatory Visit (INDEPENDENT_AMBULATORY_CARE_PROVIDER_SITE_OTHER): Payer: Medicare Other

## 2020-09-28 ENCOUNTER — Encounter: Payer: Self-pay | Admitting: Sports Medicine

## 2020-09-28 DIAGNOSIS — M109 Gout, unspecified: Secondary | ICD-10-CM

## 2020-09-28 DIAGNOSIS — M10079 Idiopathic gout, unspecified ankle and foot: Secondary | ICD-10-CM

## 2020-09-28 DIAGNOSIS — Z8739 Personal history of other diseases of the musculoskeletal system and connective tissue: Secondary | ICD-10-CM | POA: Diagnosis not present

## 2020-09-28 DIAGNOSIS — M19079 Primary osteoarthritis, unspecified ankle and foot: Secondary | ICD-10-CM

## 2020-09-28 DIAGNOSIS — M779 Enthesopathy, unspecified: Secondary | ICD-10-CM | POA: Diagnosis not present

## 2020-09-28 DIAGNOSIS — M19071 Primary osteoarthritis, right ankle and foot: Secondary | ICD-10-CM

## 2020-09-28 DIAGNOSIS — M79671 Pain in right foot: Secondary | ICD-10-CM

## 2020-09-28 NOTE — Progress Notes (Signed)
Subjective: Donna Castaneda is a 65 y.o. female patient who presents to office for evaluation of Right foot pain. Patient reports a history of gout states that previously when she is taking colchicine it is helped but it no longer helps so her primary doctor put her on Uloric states that she has been taking Uloric for roughly about 6 weeks states that she still has some soreness on the big toe and some pain that radiates across the top of the foot to the start.  Patient admits pain directly worse at the big toe joint with some discoloration and touchy at times.  Patient reports because of her kidney issues she has to drink at least 5 glasses of water a day.  Patient denies any other pedal complaints at this time.  Patient Active Problem List   Diagnosis Date Noted   HTN (hypertension) 11/03/2017   Obesity 11/03/2017   Syncope 11/02/2017    Current Outpatient Medications on File Prior to Visit  Medication Sig Dispense Refill   buPROPion (WELLBUTRIN XL) 300 MG 24 hr tablet      cloNIDine (CATAPRES) 0.2 MG tablet      triamcinolone cream (KENALOG) 0.1 % APP THIN LAYER EXT AA TID PRF ITCHING     acetaminophen (TYLENOL) 325 MG tablet Take 2 tablets (650 mg total) by mouth every 6 (six) hours as needed for mild pain (or Fever >/= 101).     allopurinol (ZYLOPRIM) 100 MG tablet      buPROPion (WELLBUTRIN XL) 300 MG 24 hr tablet Take 300 mg by mouth daily.     cloNIDine (CATAPRES) 0.1 MG tablet Take 0.1 mg by mouth See admin instructions. Take 0.1 mg by mouth in the morning and an additional 0.1 mg at suppertime if Systolic reading is 413 or greater     colchicine 0.6 MG tablet Take by mouth.     dicyclomine (BENTYL) 10 MG capsule Take 10 mg by mouth 4 (four) times daily as needed for spasms.     EASY TOUCH PEN NEEDLES 32G X 6 MM MISC use as directed every day     febuxostat (ULORIC) 40 MG tablet Take 40 mg by mouth daily.     KERENDIA 20 MG TABS Take 1 tablet by mouth daily.     LANTUS SOLOSTAR 100  UNIT/ML Solostar Pen SMARTSIG:5 Unit(s) SUB-Q Every Night     losartan (COZAAR) 100 MG tablet Take 100 mg by mouth daily.     metFORMIN (GLUCOPHAGE) 500 MG tablet      metFORMIN (GLUCOPHAGE-XR) 500 MG 24 hr tablet Take 500 mg by mouth daily with supper.     ondansetron (ZOFRAN) 4 MG tablet Take 4 mg by mouth daily.     OZEMPIC, 0.25 OR 0.5 MG/DOSE, 2 MG/1.5ML SOPN SMARTSIG:0.25 Milligram(s) SUB-Q Once a Week     QUEtiapine (SEROQUEL) 100 MG tablet Take 100 mg by mouth at bedtime.     tetrahydrozoline-zinc (VISINE-AC) 0.05-0.25 % ophthalmic solution Place 2 drops into both eyes 3 (three) times daily as needed (for redness or irritation).     No current facility-administered medications on file prior to visit.    Allergies  Allergen Reactions   Amlodipine Other (See Comments)    Vertigo- rendered patient unable to drive    Benicar [Olmesartan] Other (See Comments)    Cause IBS-like symptoms   Shrimp [Shellfish Allergy] Other (See Comments)    Limited intake due to issue with gout    Objective:  General: Alert and oriented  x3 in no acute distress  Dermatology: No significant swelling, warmth, faint redness present on the Right big toe joint, no open lesions bilateral lower extremities, no webspace macerations, no ecchymosis bilateral, all nails x 10 are well manicured.  Vascular: Dorsalis Pedis and Posterior Tibial pedal pulses 1/4, Capillary Fill Time 3 seconds,(+) pedal hair growth bilateral, temperature gradient appears normal at the big toe joint on the right.  Neurology: Johney Maine sensation intact via light touch bilateral.  Musculoskeletal: There is tenderness with palpation at right great toe joint with limited range of motion especially on end range of dorsiflexion.  Gait: Unassisted  Xrays  Right   Impression: Joint space narrowing consistent with arthritis at the big toe joint       Assessment and Plan: Problem List Items Addressed This Visit   None Visit Diagnoses      Arthritis of foot    -  Primary   Relevant Medications   allopurinol (ZYLOPRIM) 100 MG tablet   colchicine 0.6 MG tablet   febuxostat (ULORIC) 40 MG tablet   Right foot pain       History of gout       Capsulitis            -Complete examination performed -Xrays reviewed -Discussed treatement options for gouty arthritis and hallux rigidus -Dispensed bunion shield for patient to use as directed -Advised patient to use topical Voltaren to the big toe joint -Patient to return if flareup occurs for an injection or sooner problems arise.  Landis Martins, DPM

## 2020-12-16 ENCOUNTER — Ambulatory Visit: Payer: Medicare Other | Admitting: Sports Medicine

## 2021-04-20 DIAGNOSIS — E1122 Type 2 diabetes mellitus with diabetic chronic kidney disease: Secondary | ICD-10-CM | POA: Diagnosis not present

## 2021-04-20 DIAGNOSIS — E038 Other specified hypothyroidism: Secondary | ICD-10-CM | POA: Diagnosis not present

## 2021-04-20 DIAGNOSIS — M19011 Primary osteoarthritis, right shoulder: Secondary | ICD-10-CM | POA: Diagnosis not present

## 2021-04-20 DIAGNOSIS — I10 Essential (primary) hypertension: Secondary | ICD-10-CM | POA: Diagnosis not present

## 2021-04-20 DIAGNOSIS — N189 Chronic kidney disease, unspecified: Secondary | ICD-10-CM | POA: Diagnosis not present

## 2021-04-20 DIAGNOSIS — E559 Vitamin D deficiency, unspecified: Secondary | ICD-10-CM | POA: Diagnosis not present

## 2021-04-20 DIAGNOSIS — E782 Mixed hyperlipidemia: Secondary | ICD-10-CM | POA: Diagnosis not present

## 2021-04-20 DIAGNOSIS — E785 Hyperlipidemia, unspecified: Secondary | ICD-10-CM | POA: Diagnosis not present

## 2021-04-20 DIAGNOSIS — M19071 Primary osteoarthritis, right ankle and foot: Secondary | ICD-10-CM | POA: Diagnosis not present

## 2021-04-20 DIAGNOSIS — E119 Type 2 diabetes mellitus without complications: Secondary | ICD-10-CM | POA: Diagnosis not present

## 2021-04-20 DIAGNOSIS — D518 Other vitamin B12 deficiency anemias: Secondary | ICD-10-CM | POA: Diagnosis not present

## 2021-05-03 DIAGNOSIS — E1129 Type 2 diabetes mellitus with other diabetic kidney complication: Secondary | ICD-10-CM | POA: Diagnosis not present

## 2021-05-03 DIAGNOSIS — Z7689 Persons encountering health services in other specified circumstances: Secondary | ICD-10-CM | POA: Diagnosis not present

## 2021-05-03 DIAGNOSIS — N1832 Chronic kidney disease, stage 3b: Secondary | ICD-10-CM | POA: Diagnosis not present

## 2021-05-03 DIAGNOSIS — Z13220 Encounter for screening for lipoid disorders: Secondary | ICD-10-CM | POA: Diagnosis not present

## 2021-05-24 DIAGNOSIS — N1832 Chronic kidney disease, stage 3b: Secondary | ICD-10-CM | POA: Diagnosis not present

## 2021-05-24 DIAGNOSIS — E1129 Type 2 diabetes mellitus with other diabetic kidney complication: Secondary | ICD-10-CM | POA: Diagnosis not present

## 2021-05-24 DIAGNOSIS — Z139 Encounter for screening, unspecified: Secondary | ICD-10-CM | POA: Diagnosis not present

## 2021-06-14 DIAGNOSIS — N1832 Chronic kidney disease, stage 3b: Secondary | ICD-10-CM | POA: Diagnosis not present

## 2021-06-21 DIAGNOSIS — Z720 Tobacco use: Secondary | ICD-10-CM | POA: Diagnosis not present

## 2021-06-21 DIAGNOSIS — F32A Depression, unspecified: Secondary | ICD-10-CM | POA: Diagnosis not present

## 2021-06-21 DIAGNOSIS — Z0000 Encounter for general adult medical examination without abnormal findings: Secondary | ICD-10-CM | POA: Diagnosis not present

## 2021-06-21 DIAGNOSIS — E1129 Type 2 diabetes mellitus with other diabetic kidney complication: Secondary | ICD-10-CM | POA: Diagnosis not present

## 2021-06-21 DIAGNOSIS — Z139 Encounter for screening, unspecified: Secondary | ICD-10-CM | POA: Diagnosis not present

## 2021-06-21 DIAGNOSIS — Z136 Encounter for screening for cardiovascular disorders: Secondary | ICD-10-CM | POA: Diagnosis not present

## 2021-06-21 DIAGNOSIS — N1832 Chronic kidney disease, stage 3b: Secondary | ICD-10-CM | POA: Diagnosis not present

## 2021-07-10 DIAGNOSIS — M25511 Pain in right shoulder: Secondary | ICD-10-CM | POA: Diagnosis not present

## 2021-07-10 DIAGNOSIS — G8929 Other chronic pain: Secondary | ICD-10-CM | POA: Diagnosis not present

## 2021-07-27 DIAGNOSIS — N1832 Chronic kidney disease, stage 3b: Secondary | ICD-10-CM | POA: Diagnosis not present

## 2021-08-07 DIAGNOSIS — N1832 Chronic kidney disease, stage 3b: Secondary | ICD-10-CM | POA: Diagnosis not present

## 2021-09-14 IMAGING — US US RENAL
1 series · 14 of 25 positions shown · non-contrast
Comparison: None.

CLINICAL DATA: Chronic kidney disease.

EXAM:
RENAL / URINARY TRACT ULTRASOUND COMPLETE

[Series 1: us renal · 0.23mm/px · 14 of 30 slices shown]
[im 1/30]
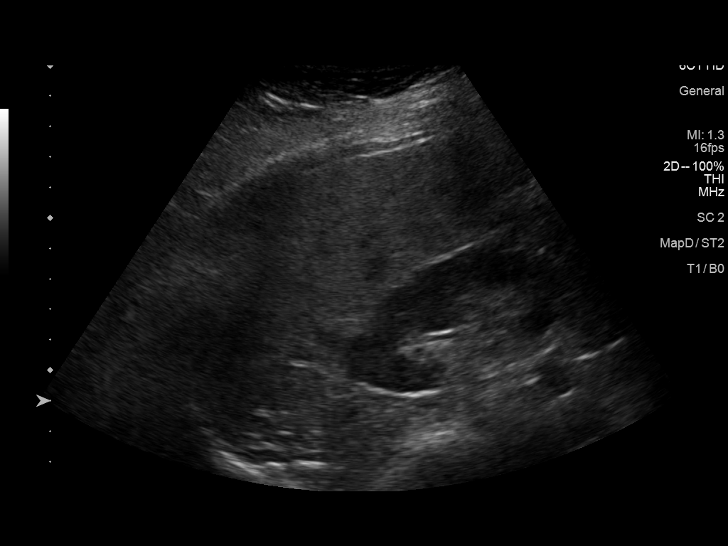
[im 3/30]
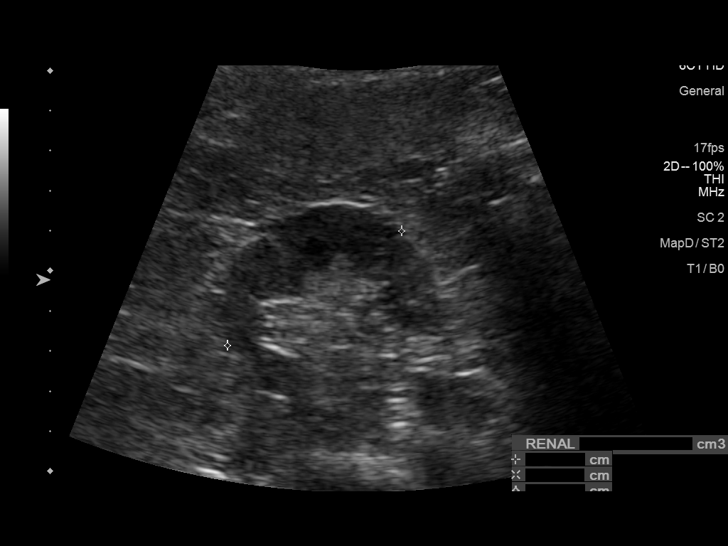
[im 5/30]
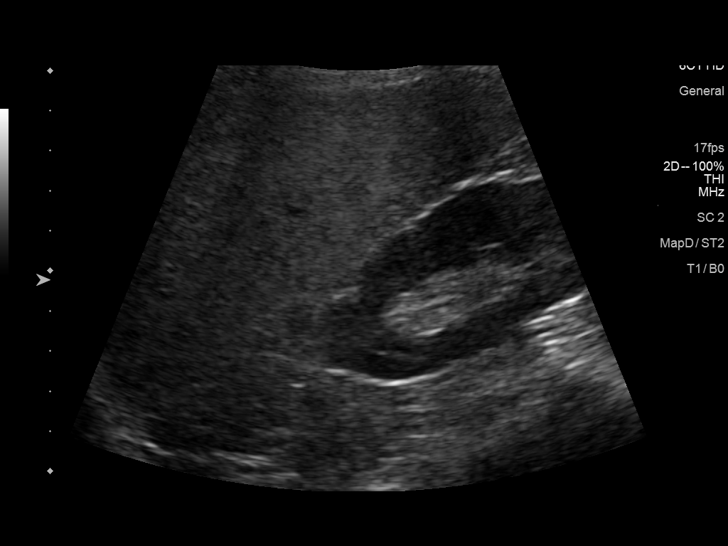
[im 8/30]
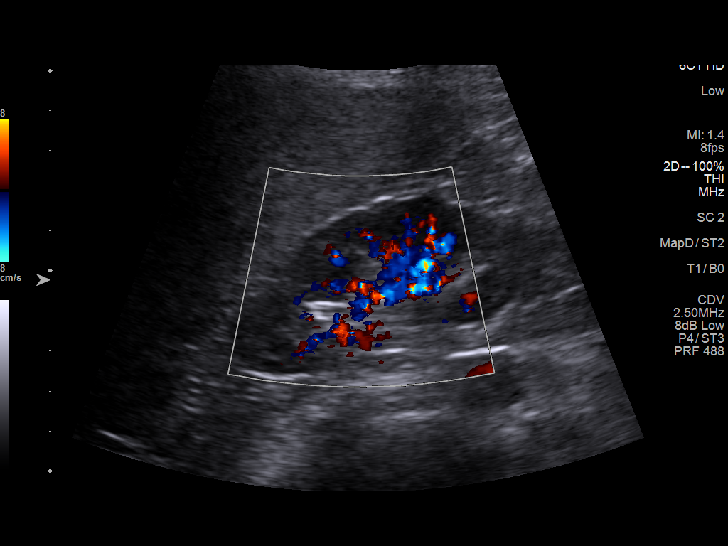
[im 10/30]
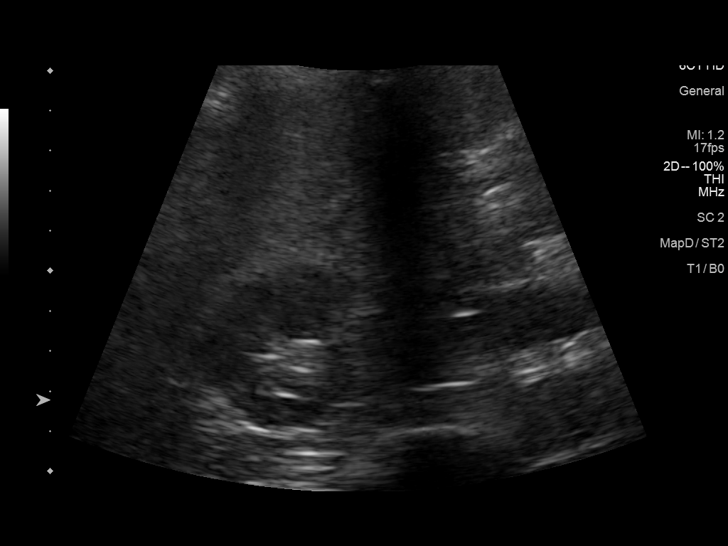
[im 11/30]
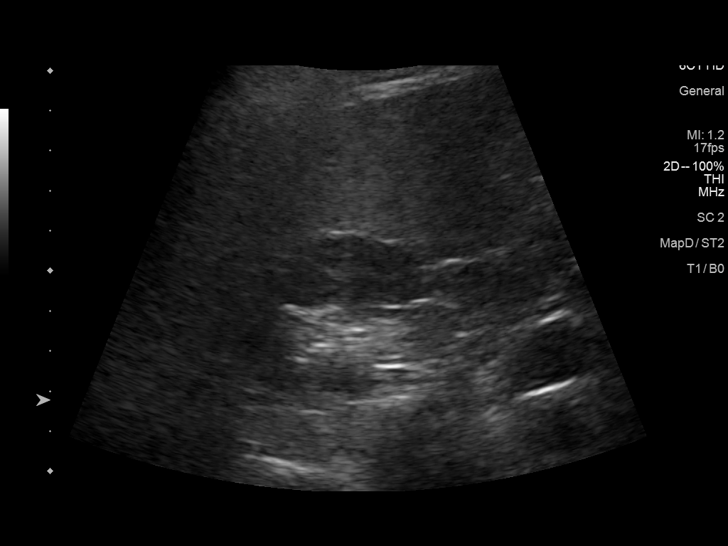
[im 14/30]
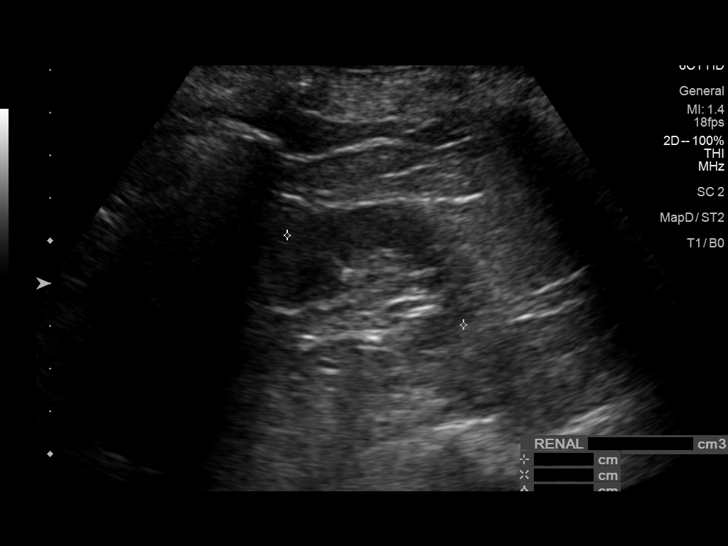
[im 16/30]
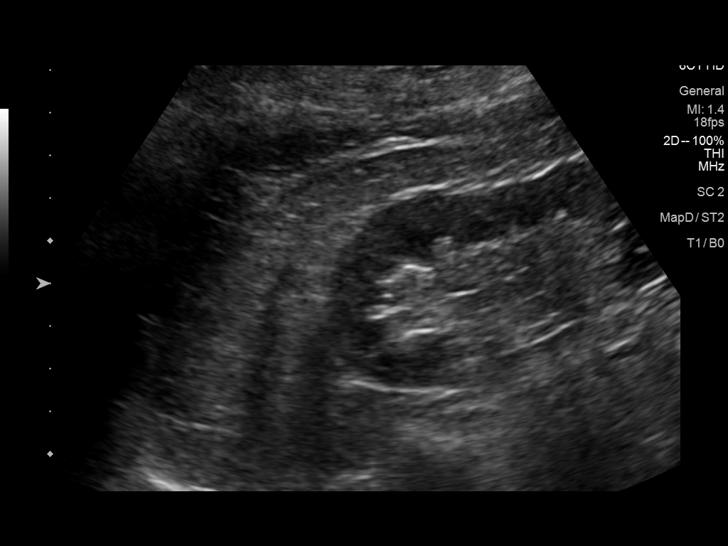
[im 19/30]
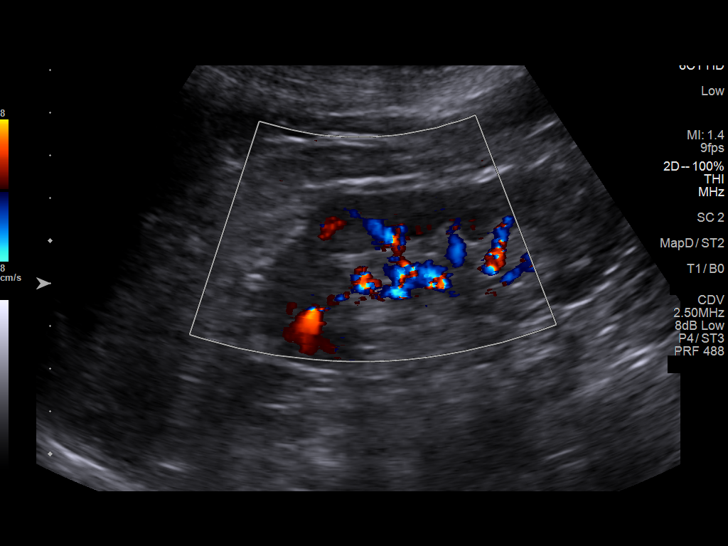
[im 20/30]
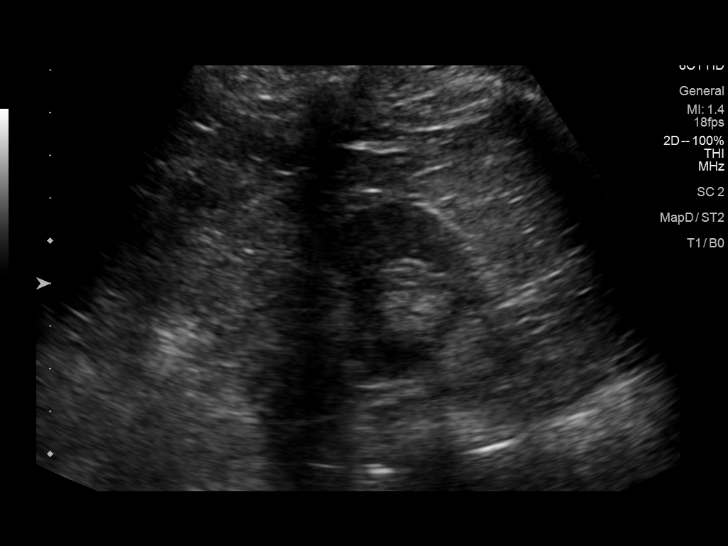
[im 22/30]
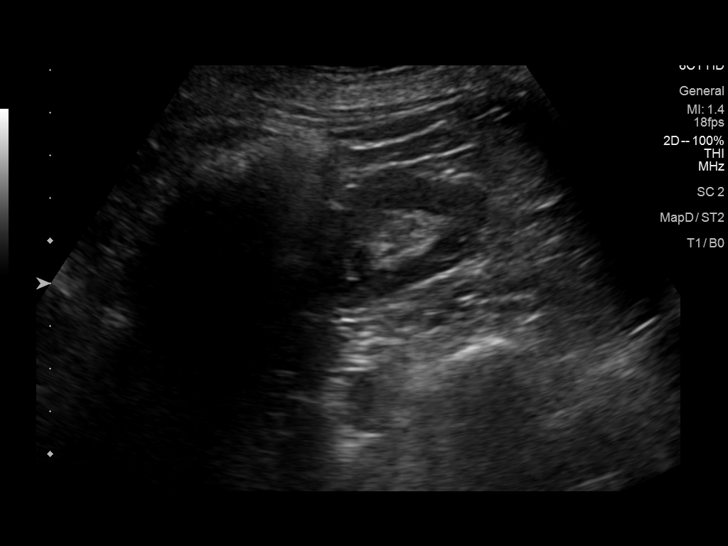
[im 25/30]
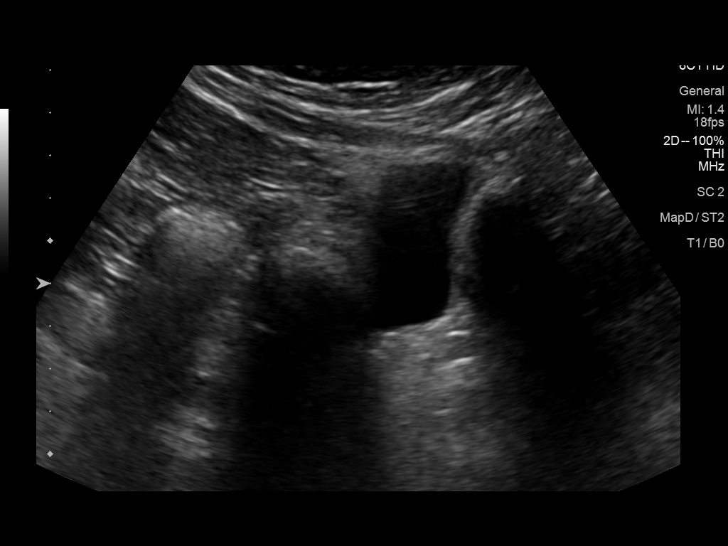
[im 27/30]
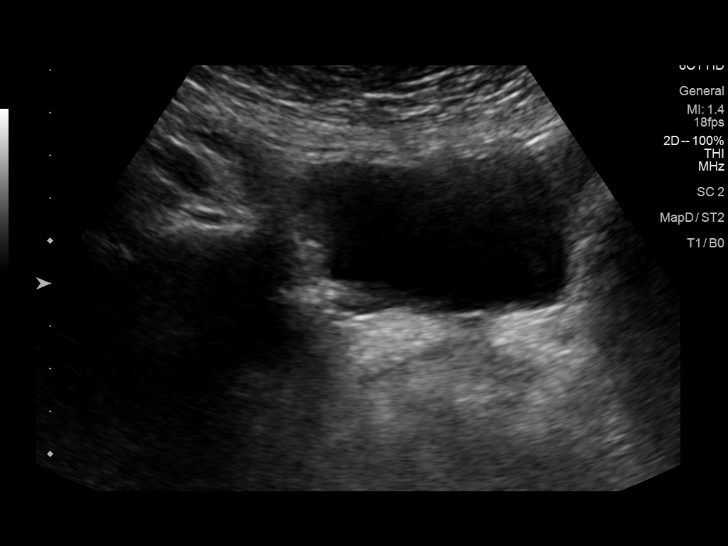
[im 30/30]
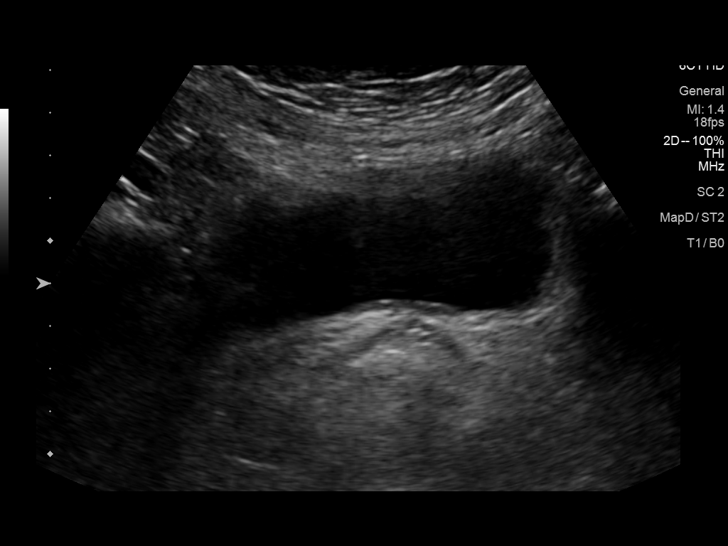

[14 of 25 positions shown; findings below may reference images not displayed]

FINDINGS: Right Kidney:

Renal measurements: 9.3 x 4.0 x 5.2 cm = volume: 102 mL.
Echogenicity within normal limits. No mass or hydronephrosis
visualized.

Left Kidney:

Renal measurements: 9.1 x 4.2 x 4.6 cm = volume: 93 mL. Echogenicity
within normal limits. No mass or hydronephrosis visualized.

Bladder:

Appears normal for degree of bladder distention.

Other:

None.
IMPRESSION: Unremarkable renal ultrasound.

## 2021-10-03 DIAGNOSIS — E1129 Type 2 diabetes mellitus with other diabetic kidney complication: Secondary | ICD-10-CM | POA: Diagnosis not present

## 2021-10-03 DIAGNOSIS — N1832 Chronic kidney disease, stage 3b: Secondary | ICD-10-CM | POA: Diagnosis not present

## 2021-10-11 DIAGNOSIS — N1832 Chronic kidney disease, stage 3b: Secondary | ICD-10-CM | POA: Diagnosis not present

## 2021-10-11 DIAGNOSIS — Z23 Encounter for immunization: Secondary | ICD-10-CM | POA: Diagnosis not present

## 2021-10-11 DIAGNOSIS — E1129 Type 2 diabetes mellitus with other diabetic kidney complication: Secondary | ICD-10-CM | POA: Diagnosis not present

## 2021-10-11 DIAGNOSIS — E785 Hyperlipidemia, unspecified: Secondary | ICD-10-CM | POA: Diagnosis not present

## 2021-12-13 DIAGNOSIS — N1832 Chronic kidney disease, stage 3b: Secondary | ICD-10-CM | POA: Diagnosis not present

## 2021-12-20 DIAGNOSIS — N1832 Chronic kidney disease, stage 3b: Secondary | ICD-10-CM | POA: Diagnosis not present

## 2021-12-20 DIAGNOSIS — E1129 Type 2 diabetes mellitus with other diabetic kidney complication: Secondary | ICD-10-CM | POA: Diagnosis not present

## 2021-12-25 DIAGNOSIS — Z1211 Encounter for screening for malignant neoplasm of colon: Secondary | ICD-10-CM | POA: Diagnosis not present

## 2021-12-25 DIAGNOSIS — E119 Type 2 diabetes mellitus without complications: Secondary | ICD-10-CM | POA: Diagnosis not present

## 2021-12-25 DIAGNOSIS — K219 Gastro-esophageal reflux disease without esophagitis: Secondary | ICD-10-CM | POA: Diagnosis not present

## 2021-12-25 DIAGNOSIS — R109 Unspecified abdominal pain: Secondary | ICD-10-CM | POA: Diagnosis not present

## 2021-12-25 DIAGNOSIS — R110 Nausea: Secondary | ICD-10-CM | POA: Diagnosis not present

## 2021-12-25 DIAGNOSIS — R7989 Other specified abnormal findings of blood chemistry: Secondary | ICD-10-CM | POA: Diagnosis not present

## 2022-01-17 DIAGNOSIS — N1832 Chronic kidney disease, stage 3b: Secondary | ICD-10-CM | POA: Diagnosis not present

## 2022-01-24 DIAGNOSIS — Z789 Other specified health status: Secondary | ICD-10-CM | POA: Diagnosis not present

## 2022-01-24 DIAGNOSIS — N1832 Chronic kidney disease, stage 3b: Secondary | ICD-10-CM | POA: Diagnosis not present

## 2022-02-08 DIAGNOSIS — E1129 Type 2 diabetes mellitus with other diabetic kidney complication: Secondary | ICD-10-CM | POA: Diagnosis not present

## 2022-02-22 DIAGNOSIS — E785 Hyperlipidemia, unspecified: Secondary | ICD-10-CM | POA: Diagnosis not present

## 2022-02-22 DIAGNOSIS — E1129 Type 2 diabetes mellitus with other diabetic kidney complication: Secondary | ICD-10-CM | POA: Diagnosis not present

## 2022-02-22 DIAGNOSIS — N1832 Chronic kidney disease, stage 3b: Secondary | ICD-10-CM | POA: Diagnosis not present

## 2022-02-28 DIAGNOSIS — Z139 Encounter for screening, unspecified: Secondary | ICD-10-CM | POA: Diagnosis not present

## 2022-02-28 DIAGNOSIS — E785 Hyperlipidemia, unspecified: Secondary | ICD-10-CM | POA: Diagnosis not present

## 2022-02-28 DIAGNOSIS — N1832 Chronic kidney disease, stage 3b: Secondary | ICD-10-CM | POA: Diagnosis not present

## 2022-02-28 DIAGNOSIS — E1129 Type 2 diabetes mellitus with other diabetic kidney complication: Secondary | ICD-10-CM | POA: Diagnosis not present

## 2022-03-08 DIAGNOSIS — R42 Dizziness and giddiness: Secondary | ICD-10-CM | POA: Diagnosis not present

## 2022-03-08 DIAGNOSIS — I10 Essential (primary) hypertension: Secondary | ICD-10-CM | POA: Diagnosis not present

## 2022-03-08 DIAGNOSIS — E1129 Type 2 diabetes mellitus with other diabetic kidney complication: Secondary | ICD-10-CM | POA: Diagnosis not present

## 2022-03-09 DIAGNOSIS — E1129 Type 2 diabetes mellitus with other diabetic kidney complication: Secondary | ICD-10-CM | POA: Diagnosis not present

## 2022-03-09 DIAGNOSIS — I10 Essential (primary) hypertension: Secondary | ICD-10-CM | POA: Diagnosis not present

## 2022-04-26 DIAGNOSIS — M19011 Primary osteoarthritis, right shoulder: Secondary | ICD-10-CM | POA: Diagnosis not present

## 2022-04-26 DIAGNOSIS — M19012 Primary osteoarthritis, left shoulder: Secondary | ICD-10-CM | POA: Diagnosis not present

## 2022-04-26 DIAGNOSIS — R42 Dizziness and giddiness: Secondary | ICD-10-CM | POA: Diagnosis not present

## 2022-05-03 DIAGNOSIS — I6523 Occlusion and stenosis of bilateral carotid arteries: Secondary | ICD-10-CM | POA: Diagnosis not present

## 2022-05-03 DIAGNOSIS — R42 Dizziness and giddiness: Secondary | ICD-10-CM | POA: Diagnosis not present

## 2022-05-08 DIAGNOSIS — R42 Dizziness and giddiness: Secondary | ICD-10-CM | POA: Diagnosis not present

## 2022-05-09 DIAGNOSIS — E1129 Type 2 diabetes mellitus with other diabetic kidney complication: Secondary | ICD-10-CM | POA: Diagnosis not present

## 2022-05-09 DIAGNOSIS — I10 Essential (primary) hypertension: Secondary | ICD-10-CM | POA: Diagnosis not present

## 2022-05-15 DIAGNOSIS — R42 Dizziness and giddiness: Secondary | ICD-10-CM | POA: Diagnosis not present

## 2022-05-24 DIAGNOSIS — R42 Dizziness and giddiness: Secondary | ICD-10-CM | POA: Diagnosis not present

## 2022-05-24 DIAGNOSIS — I472 Ventricular tachycardia, unspecified: Secondary | ICD-10-CM | POA: Diagnosis not present

## 2022-05-29 ENCOUNTER — Encounter: Payer: Self-pay | Admitting: Cardiology

## 2022-05-29 ENCOUNTER — Encounter: Payer: Self-pay | Admitting: *Deleted

## 2022-05-29 DIAGNOSIS — E1129 Type 2 diabetes mellitus with other diabetic kidney complication: Secondary | ICD-10-CM | POA: Insufficient documentation

## 2022-05-29 DIAGNOSIS — N183 Chronic kidney disease, stage 3 unspecified: Secondary | ICD-10-CM | POA: Insufficient documentation

## 2022-05-30 ENCOUNTER — Encounter: Payer: Self-pay | Admitting: Cardiology

## 2022-05-30 ENCOUNTER — Ambulatory Visit: Payer: Medicare Other | Attending: Cardiology | Admitting: Cardiology

## 2022-05-30 VITALS — BP 108/72 | HR 64 | Ht 63.0 in | Wt 194.0 lb

## 2022-05-30 DIAGNOSIS — N1832 Chronic kidney disease, stage 3b: Secondary | ICD-10-CM

## 2022-05-30 DIAGNOSIS — I472 Ventricular tachycardia, unspecified: Secondary | ICD-10-CM

## 2022-05-30 DIAGNOSIS — R42 Dizziness and giddiness: Secondary | ICD-10-CM

## 2022-05-30 DIAGNOSIS — Z7984 Long term (current) use of oral hypoglycemic drugs: Secondary | ICD-10-CM

## 2022-05-30 DIAGNOSIS — Z7985 Long-term (current) use of injectable non-insulin antidiabetic drugs: Secondary | ICD-10-CM | POA: Diagnosis not present

## 2022-05-30 DIAGNOSIS — E1122 Type 2 diabetes mellitus with diabetic chronic kidney disease: Secondary | ICD-10-CM | POA: Diagnosis not present

## 2022-05-30 DIAGNOSIS — I10 Essential (primary) hypertension: Secondary | ICD-10-CM | POA: Diagnosis not present

## 2022-05-30 NOTE — Progress Notes (Unsigned)
Cardiology Consultation:    Date:  05/30/2022   ID:  ACHANTE STURDY, DOB 05-Apr-1955, MRN [id number]  PCP:  Charlott Rakes, MD  Cardiologist:  Gypsy Balsam, MD   Referring MD: Galvin Proffer, MD   Chief Complaint  Patient presents with   Dizziness    Ongoing for 6 months    History of Present Illness:    Donna Castaneda is a 67 y.o. female who is being seen today for the evaluation of dizziness at the request of Hague, Imran P, MD. past medical history significant for hypertension, dyslipidemia, diabetes, chronic kidney failure.  She was referred to [place] because of episode of dizziness.  She did wear monitor monitor showed some nonsustained ventricular tachycardia which was asymptomatic it look like dizziness happened when she gets up very quickly states to orthostatic hypotension her blood pressure is low today even though we did not demonstrate orthostatic hypotension today the way she described it look like it is she never get dizzy when she is sitting she never get dizzy when she is laying down turning her head does not make much difference.  She does not feel any palpitations.  When she gets up and she started walking she will get dizzy 1 time she fell down but never injured herself.  Denies have any chest pain tightness squeezing pressure burning chest.  She does get short of breath while walking.  She never smoked, she is originally from [place].  Does have family history of coronary artery disease but not premature she is not on any special diet  Past Medical History:  Diagnosis Date   Diabetes mellitus with renal complications (HCC)    HTN (hypertension) 11/03/2017   Mucous retention cyst of salivary gland 07/01/2018   Last Assessment & Plan: Formatting of this note might be different from the original. Concern over a lesion of her palate. For a week or so, she noticed a bump on the central aspect of her hard palate.  Was nontender.  Eventually it popped and there was an  indentation, "hole".  She wants to make sure it is okay.  No prior history of similar symptoms.  No history of tobacco abuse. EXAM shows normal   Obesity 11/03/2017   Stage 3 chronic kidney disease (HCC)    Syncope 11/02/2017    Past Surgical History:  Procedure Laterality Date   ABDOMINAL HYSTERECTOMY  2002   Complete; not due to cancer   APPENDECTOMY  1964   CHOLECYSTECTOMY     TONGUE SURGERY  11/07/2016   Tongue neoplasm excision   TONSILLECTOMY  1976    Current Medications: Current Meds  Medication Sig   atorvastatin (LIPITOR) 10 MG tablet Take 10 mg by mouth daily.   Azilsartan-Chlorthalidone (EDARBYCLOR) 40-25 MG TABS Take 1 tablet by mouth daily.   empagliflozin (JARDIANCE) 25 MG TABS tablet Take 25 mg by mouth daily.   KERENDIA 20 MG TABS Take 1 tablet by mouth daily.   OZEMPIC, 0.25 OR 0.5 MG/DOSE, 2 MG/1.5ML SOPN Inject 1 mg into the skin once a week.   QUEtiapine (SEROQUEL) 100 MG tablet Take 100 mg by mouth at bedtime.   [DISCONTINUED] acetaminophen (TYLENOL) 325 MG tablet Take 2 tablets (650 mg total) by mouth every 6 (six) hours as needed for mild pain (or Fever >/= 101).   [DISCONTINUED] allopurinol (ZYLOPRIM) 100 MG tablet Take 100 mg by mouth daily.   [DISCONTINUED] buPROPion (WELLBUTRIN XL) 300 MG 24 hr tablet Take 300 mg by mouth  daily.   [DISCONTINUED] buPROPion (WELLBUTRIN XL) 300 MG 24 hr tablet Take 300 mg by mouth daily.   [DISCONTINUED] cloNIDine (CATAPRES) 0.1 MG tablet Take 0.1 mg by mouth See admin instructions. Take 0.1 mg by mouth in the morning and an additional 0.1 mg at suppertime if Systolic reading is 180 or greater   [DISCONTINUED] cloNIDine (CATAPRES) 0.2 MG tablet Take 0.2 mg by mouth daily.   [DISCONTINUED] colchicine 0.6 MG tablet Take 0.6 mg by mouth daily.   [DISCONTINUED] dicyclomine (BENTYL) 10 MG capsule Take 10 mg by mouth 4 (four) times daily as needed for spasms.   [DISCONTINUED] EASY TOUCH PEN NEEDLES 32G X 6 MM MISC 1 each by Other  route See admin instructions. Injection administration   [DISCONTINUED] febuxostat (ULORIC) 40 MG tablet Take 40 mg by mouth daily.   [DISCONTINUED] LANTUS SOLOSTAR 100 UNIT/ML Solostar Pen Inject 5 Units into the skin at bedtime.   [DISCONTINUED] losartan (COZAAR) 100 MG tablet Take 100 mg by mouth daily.   [DISCONTINUED] metFORMIN (GLUCOPHAGE) 500 MG tablet Take 500 mg by mouth daily with breakfast.   [DISCONTINUED] metFORMIN (GLUCOPHAGE-XR) 500 MG 24 hr tablet Take 500 mg by mouth daily with supper.   [DISCONTINUED] ondansetron (ZOFRAN) 4 MG tablet Take 4 mg by mouth daily.   [DISCONTINUED] tetrahydrozoline-zinc (VISINE-AC) 0.05-0.25 % ophthalmic solution Place 2 drops into both eyes 3 (three) times daily as needed (for redness or irritation).   [DISCONTINUED] triamcinolone cream (KENALOG) 0.1 % Apply 1 Application topically 2 (two) times daily.     Allergies:   Amlodipine, Benicar [olmesartan], and Shrimp [shellfish allergy]   Social History   Socioeconomic History   Marital status: Married    Spouse name: Not on file   Number of children: Not on file   Years of education: Not on file   Highest education level: Not on file  Occupational History   Not on file  Tobacco Use   Smoking status: Never   Smokeless tobacco: Never  Substance and Sexual Activity   Alcohol use: Not Currently   Drug use: Never   Sexual activity: Not on file  Other Topics Concern   Not on file  Social History Narrative   Not on file   Social Determinants of Health   Financial Resource Strain: Not on file  Food Insecurity: Not on file  Transportation Needs: Not on file  Physical Activity: Not on file  Stress: Not on file  Social Connections: Not on file     Family History: The patient's family history includes Diabetes in her mother; Heart disease in her father; Hypertension in her mother; Stroke in her brother. ROS:   Please see the history of present illness.    All 14 point review of systems  negative except as described per history of present illness.  EKGs/Labs/Other Studies Reviewed:    The following studies were reviewed today: I do have monitor which show average heart rate of 71, lowest 49 highest 158 1 run of ventricular tachycardia 13 bpm at rate of 158.  Asymptomatic  EKG:  EKG is  ordered today.  The ekg ordered today demonstrates normal sinus rhythm, cannot rule out anterior wall MI, no ST segment changes  Recent Labs: No results found for requested labs within last 365 days.  Recent Lipid Panel No results found for: "CHOL", "TRIG", "HDL", "CHOLHDL", "VLDL", "LDLCALC", "LDLDIRECT"  Physical Exam:    VS:  BP 108/72 (BP Location: Left Arm, Patient Position: Sitting)   Pulse 64  Ht 5\' 3"  (1.6 m)   Wt 194 lb (88 kg)   SpO2 98%   BMI 34.37 kg/m     Wt Readings from Last 3 Encounters:  05/30/22 194 lb (88 kg)  05/24/22 195 lb (88.5 kg)     GEN:  Well nourished, well developed in no acute distress HEENT: Normal NECK: No JVD; No carotid bruits LYMPHATICS: No lymphadenopathy CARDIAC: RRR, no murmurs, no rubs, no gallops RESPIRATORY:  Clear to auscultation without rales, wheezing or rhonchi  ABDOMEN: Soft, non-tender, non-distended MUSCULOSKELETAL:  No edema; No deformity  SKIN: Warm and dry NEUROLOGIC:  Alert and oriented x 3 PSYCHIATRIC:  Normal affect   ASSESSMENT:    1. Dizziness   2. Primary hypertension   3. Type 2 diabetes mellitus with stage 3b chronic kidney disease, without long-term current use of insulin (HCC)    PLAN:    In order of problems listed above:  Dizziness look like orthostatic.  I asked her to liberate some salt.  She said she avoids all completely her blood pressure is on the lower side.  A lot of medication has been already withdrawn which was a good idea. Nonsustained ventricular tachycardia need to be stratified.  I will ask her to have an echocardiogram to assess left ventricle ejection fraction, will do stress test as  well to make sure she does not have any inducible ischemia.  She does have multiple risk of 448 at the same time her ability to exercise limited. Type 2 diabetes followed by internal medicine team apparently stable. Dyslipidemia recently she started with statin I did review K PN which show me her LDL 88 HDL 68. Essential hypertension blood pressure on the lower side again I asked her to liberate some salt intake and drink plenty of fluids   Medication Adjustments/Labs and Tests Ordered: Current medicines are reviewed at length with the patient today.  Concerns regarding medicines are outlined above.  No orders of the defined types were placed in this encounter.  No orders of the defined types were placed in this encounter.   Signed, Georgeanna Lea, MD, [facility]. 05/30/2022 1:42 PM    [facility]

## 2022-05-30 NOTE — Patient Instructions (Signed)
Medication Instructions:  Your physician recommends that you continue on your current medications as directed. Please refer to the Current Medication list given to you today.  *If you need a refill on your cardiac medications before your next appointment, please call your pharmacy*   Lab Work: None ordered If you have labs (blood work) drawn today and your tests are completely normal, you will receive your results only by: MyChart Message (if you have MyChart) OR A paper copy in the mail If you have any lab test that is abnormal or we need to change your treatment, we will call you to review the results.   Testing/Procedures: Your physician has requested that you have a lexiscan myoview. For further information please visit [email or web address] Please follow instruction sheet, as given.  The test will take approximately 3 to 4 hours to complete; you may bring reading material.  If someone comes with you to your appointment, they will need to remain in the main lobby due to limited space in the testing area. **If you are pregnant or breastfeeding, please notify the nuclear lab prior to your appointment**  How to prepare for your Myocardial Perfusion Test: Do not eat or drink 3 hours prior to your test, except you may have water. Do not consume products containing caffeine (regular or decaffeinated) 12 hours prior to your test. (ex: coffee, chocolate, sodas, tea). Do bring a list of your current medications with you.  If not listed below, you may take your medications as normal. Do wear comfortable clothes (no dresses or overalls) and walking shoes, tennis shoes preferred (No heels or open toe shoes are allowed). Do NOT wear cologne, perfume, aftershave, or lotions (deodorant is allowed). If these instructions are not followed, your test will have to be rescheduled.  Your physician has requested that you have an echocardiogram. Echocardiography is a painless test that uses sound waves to  create images of your heart. It provides your doctor with information about the size and shape of your heart and how well your heart's chambers and valves are working. This procedure takes approximately one hour. There are no restrictions for this procedure.   Follow-Up: At [facility], you and your health needs are our priority.  As part of our continuing mission to provide you with exceptional heart care, we have created designated Provider Care Teams.  These Care Teams include your primary Cardiologist (physician) and Advanced Practice Providers (APPs -  Physician Assistants and Nurse Practitioners) who all work together to provide you with the care you need, when you need it.  We recommend signing up for the patient portal called "MyChart".  Sign up information is provided on this After Visit Summary.  MyChart is used to connect with patients for Virtual Visits (Telemedicine).  Patients are able to view lab/test results, encounter notes, upcoming appointments, etc.  Non-urgent messages can be sent to your provider as well.   To learn more about what you can do with MyChart, go to [email or web address].    Your next appointment:   2 month(s)  The format for your next appointment:   In Person  Provider:   Gypsy Balsam, MD   Other Instructions Cardiac Nuclear Scan A cardiac nuclear scan is a test that is done to check the flow of blood to your heart. It is done when you are resting and when you are exercising. The test looks for problems such as: Not enough blood reaching a portion of the heart. The heart muscle not working as  it should. You may need this test if: You have heart disease. You have had lab results that are not normal. You have had heart surgery or a balloon procedure to open up blocked arteries (angioplasty). You have chest pain. You have shortness of breath. In this test, a special dye (tracer) is put into your bloodstream. The tracer will travel to your heart.  A camera will then take pictures of your heart to see how the tracer moves through your heart. This test is usually done at a hospital and takes 2-4 hours. Tell a doctor about: Any allergies you have. All medicines you are taking, including vitamins, herbs, eye drops, creams, and over-the-counter medicines. Any problems you or family members have had with anesthetic medicines. Any blood disorders you have. Any surgeries you have had. Any medical conditions you have. Whether you are pregnant or may be pregnant. What are the risks? Generally, this is a safe test. However, problems may occur, such as: Serious chest pain and heart attack. This is only a risk if the stress portion of the test is done. Rapid heartbeat. A feeling of warmth in your chest. This feeling usually does not last long. Allergic reaction to the tracer. What happens before the test? Ask your doctor about changing or stopping your normal medicines. This is important. Follow instructions from your doctor about what you cannot eat or drink. Remove your jewelry on the day of the test. What happens during the test? An IV tube will be inserted into one of your veins. Your doctor will give you a small amount of tracer through the IV tube. You will wait for 20-40 minutes while the tracer moves through your bloodstream. Your heart will be monitored with an electrocardiogram (ECG). You will lie down on an exam table. Pictures of your heart will be taken for about 15-20 minutes. You may also have a stress test. For this test, one of these things may be done: You will be asked to exercise on a treadmill or a stationary bike. You will be given medicines that will make your heart work harder. This is done if you are unable to exercise. When blood flow to your heart has peaked, a tracer will again be given through the IV tube. After 20-40 minutes, you will get back on the exam table. More pictures will be taken of your  heart. Depending on the tracer that is used, more pictures may need to be taken 3-4 hours later. Your IV tube will be removed when the test is over. The test may vary among doctors and hospitals. What happens after the test? Ask your doctor: Whether you can return to your normal schedule, including diet, activities, and medicines. Whether you should drink more fluids. This will help to remove the tracer from your body. Drink enough fluid to keep your pee (urine) pale yellow. Ask your doctor, or the department that is doing the test: When will my results be ready? How will I get my results? Summary A cardiac nuclear scan is a test that is done to check the flow of blood to your heart. Tell your doctor whether you are pregnant or may be pregnant. Before the test, ask your doctor about changing or stopping your normal medicines. This is important. Ask your doctor whether you can return to your normal activities. You may be asked to drink more fluids. This information is not intended to replace advice given to you by your health care provider. Make sure you discuss any  questions you have with your health care provider. Document Revised: 04/16/2018 Document Reviewed: 06/10/2017 Elsevier Patient Education  2021 Elsevier Inc.    Echocardiogram An echocardiogram is a test that uses sound waves (ultrasound) to produce images of the heart. Images from an echocardiogram can provide important information about: Heart size and shape. The size and thickness and movement of your heart's walls. Heart muscle function and strength. Heart valve function or if you have stenosis. Stenosis is when the heart valves are too narrow. If blood is flowing backward through the heart valves (regurgitation). A tumor or infectious growth around the heart valves. Areas of heart muscle that are not working well because of poor blood flow or injury from a heart attack. Aneurysm detection. An aneurysm is a weak or  damaged part of an artery wall. The wall bulges out from the normal force of blood pumping through the body. Tell a health care provider about: Any allergies you have. All medicines you are taking, including vitamins, herbs, eye drops, creams, and over-the-counter medicines. Any blood disorders you have. Any surgeries you have had. Any medical conditions you have. Whether you are pregnant or may be pregnant. What are the risks? Generally, this is a safe test. However, problems may occur, including an allergic reaction to dye (contrast) that may be used during the test. What happens before the test? No specific preparation is needed. You may eat and drink normally. What happens during the test? You will take off your clothes from the waist up and put on a hospital gown. Electrodes or electrocardiogram (ECG)patches may be placed on your chest. The electrodes or patches are then connected to a device that monitors your heart rate and rhythm. You will lie down on a table for an ultrasound exam. A gel will be applied to your chest to help sound waves pass through your skin. A handheld device, called a transducer, will be pressed against your chest and moved over your heart. The transducer produces sound waves that travel to your heart and bounce back (or "echo" back) to the transducer. These sound waves will be captured in real-time and changed into images of your heart that can be viewed on a video monitor. The images will be recorded on a computer and reviewed by your health care provider. You may be asked to change positions or hold your breath for a short time. This makes it easier to get different views or better views of your heart. In some cases, you may receive contrast through an IV in one of your veins. This can improve the quality of the pictures from your heart. The procedure may vary among health care providers and hospitals.    What can I expect after the test? You may return to your  normal, everyday life, including diet, activities, and medicines, unless your health care provider tells you not to do that. Follow these instructions at home: It is up to you to get the results of your test. Ask your health care provider, or the department that is doing the test, when your results will be ready. Keep all follow-up visits. This is important. Summary An echocardiogram is a test that uses sound waves (ultrasound) to produce images of the heart. Images from an echocardiogram can provide important information about the size and shape of your heart, heart muscle function, heart valve function, and other possible heart problems. You do not need to do anything to prepare before this test. You may eat and drink normally. After  the echocardiogram is completed, you may return to your normal, everyday life, unless your health care provider tells you not to do that. This information is not intended to replace advice given to you by your health care provider. Make sure you discuss any questions you have with your health care provider. Document Revised: 08/18/2019 Document Reviewed: 08/18/2019 Elsevier Patient Education  2021 [employer].

## 2022-06-07 ENCOUNTER — Telehealth (HOSPITAL_COMMUNITY): Payer: Self-pay | Admitting: *Deleted

## 2022-06-07 NOTE — Telephone Encounter (Signed)
Spoke with patient and she was given detailed instructions about her STRESS TEST on 06/14/22 at 11:00.

## 2022-06-09 DIAGNOSIS — I10 Essential (primary) hypertension: Secondary | ICD-10-CM | POA: Diagnosis not present

## 2022-06-09 DIAGNOSIS — E1129 Type 2 diabetes mellitus with other diabetic kidney complication: Secondary | ICD-10-CM | POA: Diagnosis not present

## 2022-06-14 ENCOUNTER — Ambulatory Visit: Payer: Medicare Other | Attending: Cardiology

## 2022-06-14 DIAGNOSIS — I472 Ventricular tachycardia, unspecified: Secondary | ICD-10-CM

## 2022-06-14 DIAGNOSIS — I2510 Atherosclerotic heart disease of native coronary artery without angina pectoris: Secondary | ICD-10-CM | POA: Diagnosis not present

## 2022-06-14 LAB — MYOCARDIAL PERFUSION IMAGING
Peak HR: 79 {beats}/min
Rest HR: 60 {beats}/min
SRS: 2

## 2022-06-14 MED ORDER — REGADENOSON 0.4 MG/5ML IV SOLN
0.4000 mg | Freq: Once | INTRAVENOUS | Status: AC
Start: 2022-06-14 — End: 2022-06-14
  Administered 2022-06-14: 0.4 mg via INTRAVENOUS

## 2022-06-14 MED ORDER — TECHNETIUM TC 99M TETROFOSMIN IV KIT
10.9000 | PACK | Freq: Once | INTRAVENOUS | Status: AC | PRN
  Administered 2022-06-14: 10.9 via INTRAVENOUS

## 2022-06-14 MED ORDER — TECHNETIUM TC 99M TETROFOSMIN IV KIT
32.8000 | PACK | Freq: Once | INTRAVENOUS | Status: AC | PRN
  Administered 2022-06-14: 32.8 via INTRAVENOUS

## 2022-06-19 LAB — MYOCARDIAL PERFUSION IMAGING
LV dias vol: 60 mL (ref 46–106)
LV sys vol: 16 mL
Nuc Stress EF: 73 %
Rest Nuclear Isotope Dose: 10.9 mCi
SDS: 3
SSS: 5
ST Depression (mm): 0 mm
Stress Nuclear Isotope Dose: 32.8 mCi
TID: 1.1

## 2022-06-27 ENCOUNTER — Telehealth: Payer: Self-pay

## 2022-06-27 ENCOUNTER — Ambulatory Visit: Payer: Medicare Other | Attending: Cardiology

## 2022-06-27 DIAGNOSIS — E785 Hyperlipidemia, unspecified: Secondary | ICD-10-CM | POA: Diagnosis not present

## 2022-06-27 DIAGNOSIS — B3731 Acute candidiasis of vulva and vagina: Secondary | ICD-10-CM | POA: Diagnosis not present

## 2022-06-27 DIAGNOSIS — I472 Ventricular tachycardia, unspecified: Secondary | ICD-10-CM | POA: Diagnosis not present

## 2022-06-27 DIAGNOSIS — I5030 Unspecified diastolic (congestive) heart failure: Secondary | ICD-10-CM | POA: Diagnosis not present

## 2022-06-27 DIAGNOSIS — N1832 Chronic kidney disease, stage 3b: Secondary | ICD-10-CM | POA: Diagnosis not present

## 2022-06-27 DIAGNOSIS — E1129 Type 2 diabetes mellitus with other diabetic kidney complication: Secondary | ICD-10-CM | POA: Diagnosis not present

## 2022-06-27 LAB — ECHOCARDIOGRAM COMPLETE: S' Lateral: 2.7 cm

## 2022-06-27 NOTE — Telephone Encounter (Signed)
Left message on My Chart with normal results per Dr. Krasowski's note. Routed to PCP. 

## 2022-07-04 DIAGNOSIS — E1129 Type 2 diabetes mellitus with other diabetic kidney complication: Secondary | ICD-10-CM | POA: Diagnosis not present

## 2022-07-04 DIAGNOSIS — B353 Tinea pedis: Secondary | ICD-10-CM | POA: Diagnosis not present

## 2022-07-04 DIAGNOSIS — Z0189 Encounter for other specified special examinations: Secondary | ICD-10-CM | POA: Diagnosis not present

## 2022-07-04 DIAGNOSIS — Z136 Encounter for screening for cardiovascular disorders: Secondary | ICD-10-CM | POA: Diagnosis not present

## 2022-07-04 DIAGNOSIS — Z139 Encounter for screening, unspecified: Secondary | ICD-10-CM | POA: Diagnosis not present

## 2022-07-04 DIAGNOSIS — Z1389 Encounter for screening for other disorder: Secondary | ICD-10-CM | POA: Diagnosis not present

## 2022-07-04 DIAGNOSIS — I10 Essential (primary) hypertension: Secondary | ICD-10-CM | POA: Diagnosis not present

## 2022-07-04 DIAGNOSIS — Z0000 Encounter for general adult medical examination without abnormal findings: Secondary | ICD-10-CM | POA: Diagnosis not present

## 2022-07-04 DIAGNOSIS — N1832 Chronic kidney disease, stage 3b: Secondary | ICD-10-CM | POA: Diagnosis not present

## 2022-07-09 DIAGNOSIS — I10 Essential (primary) hypertension: Secondary | ICD-10-CM | POA: Diagnosis not present

## 2022-07-09 DIAGNOSIS — E785 Hyperlipidemia, unspecified: Secondary | ICD-10-CM | POA: Diagnosis not present

## 2022-08-07 ENCOUNTER — Ambulatory Visit: Payer: Medicare Other | Admitting: Cardiology

## 2022-08-09 DIAGNOSIS — E785 Hyperlipidemia, unspecified: Secondary | ICD-10-CM | POA: Diagnosis not present

## 2022-08-09 DIAGNOSIS — I10 Essential (primary) hypertension: Secondary | ICD-10-CM | POA: Diagnosis not present

## 2022-09-09 DIAGNOSIS — E785 Hyperlipidemia, unspecified: Secondary | ICD-10-CM | POA: Diagnosis not present

## 2022-09-09 DIAGNOSIS — I10 Essential (primary) hypertension: Secondary | ICD-10-CM | POA: Diagnosis not present

## 2022-10-21 DIAGNOSIS — L237 Allergic contact dermatitis due to plants, except food: Secondary | ICD-10-CM | POA: Diagnosis not present

## 2022-10-22 ENCOUNTER — Telehealth: Payer: Self-pay

## 2022-10-22 NOTE — Patient Outreach (Signed)
[facility] Assistant attempted to call patient on today regarding preventative mammogram screening. No answer from patient after multiple rings. Assistant left confidential voicemail for patient to return call.  Will call back patient back for final attempt.  Baruch Gouty [facility] Assistant [facility] [phone]

## 2022-10-24 DIAGNOSIS — Z0000 Encounter for general adult medical examination without abnormal findings: Secondary | ICD-10-CM | POA: Diagnosis not present

## 2022-10-24 DIAGNOSIS — E1129 Type 2 diabetes mellitus with other diabetic kidney complication: Secondary | ICD-10-CM | POA: Diagnosis not present

## 2022-10-24 DIAGNOSIS — Z23 Encounter for immunization: Secondary | ICD-10-CM | POA: Diagnosis not present

## 2022-10-24 DIAGNOSIS — E785 Hyperlipidemia, unspecified: Secondary | ICD-10-CM | POA: Diagnosis not present

## 2022-10-24 DIAGNOSIS — Z1231 Encounter for screening mammogram for malignant neoplasm of breast: Secondary | ICD-10-CM | POA: Diagnosis not present

## 2022-10-24 DIAGNOSIS — N1832 Chronic kidney disease, stage 3b: Secondary | ICD-10-CM | POA: Diagnosis not present

## 2022-10-31 DIAGNOSIS — I10 Essential (primary) hypertension: Secondary | ICD-10-CM | POA: Diagnosis not present

## 2022-10-31 DIAGNOSIS — E785 Hyperlipidemia, unspecified: Secondary | ICD-10-CM | POA: Diagnosis not present

## 2022-10-31 DIAGNOSIS — E1129 Type 2 diabetes mellitus with other diabetic kidney complication: Secondary | ICD-10-CM | POA: Diagnosis not present

## 2022-10-31 DIAGNOSIS — N1832 Chronic kidney disease, stage 3b: Secondary | ICD-10-CM | POA: Diagnosis not present

## 2022-11-09 DIAGNOSIS — E785 Hyperlipidemia, unspecified: Secondary | ICD-10-CM | POA: Diagnosis not present

## 2022-11-09 DIAGNOSIS — I10 Essential (primary) hypertension: Secondary | ICD-10-CM | POA: Diagnosis not present

## 2022-11-19 ENCOUNTER — Telehealth: Payer: Self-pay

## 2022-11-19 NOTE — Patient Outreach (Signed)
 [facility] Assistant attempted to call patient on today regarding preventative mammogram screening. No answer from patient after multiple rings. Assistant left confidential voicemail for patient to return call.   Baruch Gouty [facility] Assistant [facility] Population Health [phone]

## 2022-11-28 DIAGNOSIS — Z1231 Encounter for screening mammogram for malignant neoplasm of breast: Secondary | ICD-10-CM | POA: Diagnosis not present

## 2022-12-09 DIAGNOSIS — I10 Essential (primary) hypertension: Secondary | ICD-10-CM | POA: Diagnosis not present

## 2022-12-09 DIAGNOSIS — E785 Hyperlipidemia, unspecified: Secondary | ICD-10-CM | POA: Diagnosis not present

## 2023-01-09 DIAGNOSIS — I10 Essential (primary) hypertension: Secondary | ICD-10-CM | POA: Diagnosis not present

## 2023-01-09 DIAGNOSIS — E785 Hyperlipidemia, unspecified: Secondary | ICD-10-CM | POA: Diagnosis not present

## 2023-01-21 DIAGNOSIS — G8929 Other chronic pain: Secondary | ICD-10-CM | POA: Diagnosis not present

## 2023-01-21 DIAGNOSIS — M25511 Pain in right shoulder: Secondary | ICD-10-CM | POA: Diagnosis not present

## 2023-02-09 DIAGNOSIS — I10 Essential (primary) hypertension: Secondary | ICD-10-CM | POA: Diagnosis not present

## 2023-02-09 DIAGNOSIS — E785 Hyperlipidemia, unspecified: Secondary | ICD-10-CM | POA: Diagnosis not present

## 2023-02-26 DIAGNOSIS — N1832 Chronic kidney disease, stage 3b: Secondary | ICD-10-CM | POA: Diagnosis not present

## 2023-02-26 DIAGNOSIS — E1129 Type 2 diabetes mellitus with other diabetic kidney complication: Secondary | ICD-10-CM | POA: Diagnosis not present

## 2023-02-26 DIAGNOSIS — E785 Hyperlipidemia, unspecified: Secondary | ICD-10-CM | POA: Diagnosis not present

## 2023-03-09 DIAGNOSIS — E785 Hyperlipidemia, unspecified: Secondary | ICD-10-CM | POA: Diagnosis not present

## 2023-03-09 DIAGNOSIS — I10 Essential (primary) hypertension: Secondary | ICD-10-CM | POA: Diagnosis not present

## 2023-03-13 DIAGNOSIS — E785 Hyperlipidemia, unspecified: Secondary | ICD-10-CM | POA: Diagnosis not present

## 2023-03-13 DIAGNOSIS — I10 Essential (primary) hypertension: Secondary | ICD-10-CM | POA: Diagnosis not present

## 2023-03-13 DIAGNOSIS — E1129 Type 2 diabetes mellitus with other diabetic kidney complication: Secondary | ICD-10-CM | POA: Diagnosis not present

## 2023-03-13 DIAGNOSIS — N1832 Chronic kidney disease, stage 3b: Secondary | ICD-10-CM | POA: Diagnosis not present

## 2023-03-28 DIAGNOSIS — Z1211 Encounter for screening for malignant neoplasm of colon: Secondary | ICD-10-CM | POA: Diagnosis not present

## 2023-03-28 DIAGNOSIS — Z1212 Encounter for screening for malignant neoplasm of rectum: Secondary | ICD-10-CM | POA: Diagnosis not present

## 2023-04-09 DIAGNOSIS — E785 Hyperlipidemia, unspecified: Secondary | ICD-10-CM | POA: Diagnosis not present

## 2023-04-09 DIAGNOSIS — N1832 Chronic kidney disease, stage 3b: Secondary | ICD-10-CM | POA: Diagnosis not present

## 2023-04-09 DIAGNOSIS — E1129 Type 2 diabetes mellitus with other diabetic kidney complication: Secondary | ICD-10-CM | POA: Diagnosis not present

## 2023-05-09 DIAGNOSIS — E785 Hyperlipidemia, unspecified: Secondary | ICD-10-CM | POA: Diagnosis not present

## 2023-05-09 DIAGNOSIS — E1129 Type 2 diabetes mellitus with other diabetic kidney complication: Secondary | ICD-10-CM | POA: Diagnosis not present

## 2023-05-09 DIAGNOSIS — N1832 Chronic kidney disease, stage 3b: Secondary | ICD-10-CM | POA: Diagnosis not present

## 2023-07-09 DIAGNOSIS — E1129 Type 2 diabetes mellitus with other diabetic kidney complication: Secondary | ICD-10-CM | POA: Diagnosis not present

## 2023-07-09 DIAGNOSIS — E785 Hyperlipidemia, unspecified: Secondary | ICD-10-CM | POA: Diagnosis not present

## 2023-07-09 DIAGNOSIS — N1832 Chronic kidney disease, stage 3b: Secondary | ICD-10-CM | POA: Diagnosis not present

## 2023-07-10 DIAGNOSIS — E785 Hyperlipidemia, unspecified: Secondary | ICD-10-CM | POA: Diagnosis not present

## 2023-07-10 DIAGNOSIS — E1129 Type 2 diabetes mellitus with other diabetic kidney complication: Secondary | ICD-10-CM | POA: Diagnosis not present

## 2023-07-10 DIAGNOSIS — N1832 Chronic kidney disease, stage 3b: Secondary | ICD-10-CM | POA: Diagnosis not present

## 2023-07-25 DIAGNOSIS — G8929 Other chronic pain: Secondary | ICD-10-CM | POA: Diagnosis not present

## 2023-07-25 DIAGNOSIS — M25511 Pain in right shoulder: Secondary | ICD-10-CM | POA: Diagnosis not present

## 2023-07-31 DIAGNOSIS — E785 Hyperlipidemia, unspecified: Secondary | ICD-10-CM | POA: Diagnosis not present

## 2023-07-31 DIAGNOSIS — E1129 Type 2 diabetes mellitus with other diabetic kidney complication: Secondary | ICD-10-CM | POA: Diagnosis not present

## 2023-07-31 DIAGNOSIS — N1832 Chronic kidney disease, stage 3b: Secondary | ICD-10-CM | POA: Diagnosis not present

## 2023-07-31 DIAGNOSIS — Z139 Encounter for screening, unspecified: Secondary | ICD-10-CM | POA: Diagnosis not present

## 2023-07-31 DIAGNOSIS — Z136 Encounter for screening for cardiovascular disorders: Secondary | ICD-10-CM | POA: Diagnosis not present

## 2023-07-31 DIAGNOSIS — Z0000 Encounter for general adult medical examination without abnormal findings: Secondary | ICD-10-CM | POA: Diagnosis not present

## 2023-07-31 DIAGNOSIS — Z1389 Encounter for screening for other disorder: Secondary | ICD-10-CM | POA: Diagnosis not present

## 2023-08-09 DIAGNOSIS — E1129 Type 2 diabetes mellitus with other diabetic kidney complication: Secondary | ICD-10-CM | POA: Diagnosis not present

## 2023-08-09 DIAGNOSIS — E785 Hyperlipidemia, unspecified: Secondary | ICD-10-CM | POA: Diagnosis not present

## 2023-09-09 DIAGNOSIS — E1129 Type 2 diabetes mellitus with other diabetic kidney complication: Secondary | ICD-10-CM | POA: Diagnosis not present

## 2023-09-09 DIAGNOSIS — E785 Hyperlipidemia, unspecified: Secondary | ICD-10-CM | POA: Diagnosis not present

## 2023-09-11 DIAGNOSIS — N1832 Chronic kidney disease, stage 3b: Secondary | ICD-10-CM | POA: Diagnosis not present

## 2023-09-11 DIAGNOSIS — M545 Low back pain, unspecified: Secondary | ICD-10-CM | POA: Diagnosis not present

## 2023-10-09 DIAGNOSIS — E785 Hyperlipidemia, unspecified: Secondary | ICD-10-CM | POA: Diagnosis not present

## 2023-10-09 DIAGNOSIS — E1129 Type 2 diabetes mellitus with other diabetic kidney complication: Secondary | ICD-10-CM | POA: Diagnosis not present

## 2023-10-14 DIAGNOSIS — M1711 Unilateral primary osteoarthritis, right knee: Secondary | ICD-10-CM | POA: Diagnosis not present

## 2023-10-14 DIAGNOSIS — G8929 Other chronic pain: Secondary | ICD-10-CM | POA: Diagnosis not present

## 2023-10-14 DIAGNOSIS — M25561 Pain in right knee: Secondary | ICD-10-CM | POA: Diagnosis not present

## 2023-10-21 DIAGNOSIS — Z79899 Other long term (current) drug therapy: Secondary | ICD-10-CM | POA: Diagnosis not present

## 2023-10-21 DIAGNOSIS — M79609 Pain in unspecified limb: Secondary | ICD-10-CM | POA: Diagnosis not present

## 2023-10-21 DIAGNOSIS — E559 Vitamin D deficiency, unspecified: Secondary | ICD-10-CM | POA: Diagnosis not present

## 2023-10-21 DIAGNOSIS — Z01818 Encounter for other preprocedural examination: Secondary | ICD-10-CM | POA: Diagnosis not present
